# Patient Record
Sex: Female | Born: 1965
Health system: Southern US, Community
[De-identification: ages and names within clinical notes are randomized; demographics above are authoritative.]

## PROBLEM LIST (undated history)

## (undated) DIAGNOSIS — D649 Anemia, unspecified: Secondary | ICD-10-CM

## (undated) HISTORY — PX: BREAST EXCISIONAL BIOPSY: SUR124

## (undated) HISTORY — PX: HEMORRHOID SURGERY: SHX153

## (undated) HISTORY — PX: BREAST BIOPSY: SHX20

## (undated) HISTORY — PX: BREAST LUMPECTOMY: SHX2

---

## 2001-02-20 ENCOUNTER — Ambulatory Visit (HOSPITAL_COMMUNITY): Admission: RE | Admit: 2001-02-20 | Discharge: 2001-02-20 | Payer: Self-pay | Admitting: General Surgery

## 2001-02-20 ENCOUNTER — Encounter: Payer: Self-pay | Admitting: General Surgery

## 2001-05-24 ENCOUNTER — Ambulatory Visit (HOSPITAL_COMMUNITY): Admission: RE | Admit: 2001-05-24 | Discharge: 2001-05-24 | Payer: Self-pay | Admitting: General Surgery

## 2003-04-20 ENCOUNTER — Encounter: Admission: RE | Admit: 2003-04-20 | Discharge: 2003-04-20 | Payer: Self-pay | Admitting: General Surgery

## 2003-07-27 ENCOUNTER — Ambulatory Visit (HOSPITAL_COMMUNITY): Admission: RE | Admit: 2003-07-27 | Discharge: 2003-07-27 | Payer: Self-pay | Admitting: General Surgery

## 2004-03-15 ENCOUNTER — Ambulatory Visit (HOSPITAL_COMMUNITY): Admission: RE | Admit: 2004-03-15 | Discharge: 2004-03-15 | Payer: Self-pay | Admitting: Family Medicine

## 2005-04-04 ENCOUNTER — Ambulatory Visit (HOSPITAL_COMMUNITY): Admission: RE | Admit: 2005-04-04 | Discharge: 2005-04-04 | Payer: Self-pay | Admitting: Family Medicine

## 2005-08-29 ENCOUNTER — Ambulatory Visit (HOSPITAL_COMMUNITY): Admission: RE | Admit: 2005-08-29 | Discharge: 2005-08-29 | Payer: Self-pay | Admitting: General Surgery

## 2006-03-14 ENCOUNTER — Ambulatory Visit: Payer: Self-pay | Admitting: Orthopedic Surgery

## 2006-03-22 ENCOUNTER — Ambulatory Visit (HOSPITAL_COMMUNITY): Admission: RE | Admit: 2006-03-22 | Discharge: 2006-03-22 | Payer: Self-pay | Admitting: Orthopedic Surgery

## 2006-04-02 ENCOUNTER — Ambulatory Visit: Payer: Self-pay | Admitting: Orthopedic Surgery

## 2006-05-14 ENCOUNTER — Ambulatory Visit: Payer: Self-pay | Admitting: Orthopedic Surgery

## 2006-08-16 ENCOUNTER — Ambulatory Visit: Payer: Self-pay | Admitting: Orthopedic Surgery

## 2006-10-01 ENCOUNTER — Encounter: Admission: RE | Admit: 2006-10-01 | Discharge: 2006-10-01 | Payer: Self-pay | Admitting: General Surgery

## 2006-10-04 ENCOUNTER — Encounter: Admission: RE | Admit: 2006-10-04 | Discharge: 2006-10-04 | Payer: Self-pay | Admitting: General Surgery

## 2007-03-20 ENCOUNTER — Encounter: Admission: RE | Admit: 2007-03-20 | Discharge: 2007-03-20 | Payer: Self-pay | Admitting: General Surgery

## 2007-10-22 ENCOUNTER — Encounter: Admission: RE | Admit: 2007-10-22 | Discharge: 2007-10-22 | Payer: Self-pay | Admitting: General Surgery

## 2007-10-28 ENCOUNTER — Encounter: Admission: RE | Admit: 2007-10-28 | Discharge: 2007-10-28 | Payer: Self-pay | Admitting: General Surgery

## 2008-04-30 ENCOUNTER — Encounter: Admission: RE | Admit: 2008-04-30 | Discharge: 2008-04-30 | Payer: Self-pay | Admitting: General Surgery

## 2008-10-22 ENCOUNTER — Encounter: Admission: RE | Admit: 2008-10-22 | Discharge: 2008-10-22 | Payer: Self-pay | Admitting: General Surgery

## 2009-05-31 ENCOUNTER — Ambulatory Visit: Payer: Self-pay | Admitting: Orthopedic Surgery

## 2009-05-31 DIAGNOSIS — M25519 Pain in unspecified shoulder: Secondary | ICD-10-CM | POA: Insufficient documentation

## 2009-10-25 ENCOUNTER — Encounter: Admission: RE | Admit: 2009-10-25 | Discharge: 2009-10-25 | Payer: Self-pay | Admitting: General Surgery

## 2009-12-13 ENCOUNTER — Ambulatory Visit: Payer: Self-pay | Admitting: Orthopedic Surgery

## 2009-12-13 DIAGNOSIS — M5430 Sciatica, unspecified side: Secondary | ICD-10-CM | POA: Insufficient documentation

## 2009-12-13 DIAGNOSIS — M25559 Pain in unspecified hip: Secondary | ICD-10-CM | POA: Insufficient documentation

## 2009-12-13 DIAGNOSIS — M549 Dorsalgia, unspecified: Secondary | ICD-10-CM | POA: Insufficient documentation

## 2009-12-17 ENCOUNTER — Encounter: Payer: Self-pay | Admitting: Orthopedic Surgery

## 2010-01-26 ENCOUNTER — Telehealth: Payer: Self-pay | Admitting: Orthopedic Surgery

## 2010-02-05 ENCOUNTER — Ambulatory Visit (HOSPITAL_COMMUNITY)
Admission: RE | Admit: 2010-02-05 | Discharge: 2010-02-05 | Payer: Self-pay | Source: Home / Self Care | Admitting: Family Medicine

## 2010-03-27 ENCOUNTER — Encounter: Payer: Self-pay | Admitting: General Surgery

## 2010-04-05 NOTE — Progress Notes (Signed)
Summary: call from patient  Phone Note Call from Patient   Caller: Patient Summary of Call: Patient called today about injury to 2nd toe, DOI last Thurs 01/20/10, states happened when working out.  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  Advised that since Dr Romeo Apple will be out till Tues 02/01/10 that she contact PCP or Urgent care or one of the Urgent care orthopedic/sports medicine offices.  States may do that and will call back if needs appt here. Initial call taken by: Cammie Sickle,  January 26, 2010 9:18 AM

## 2010-04-05 NOTE — Progress Notes (Signed)
Summary: Progress note  Progress note   Imported By: Jacklynn Ganong 05/28/2009 09:08:38  _____________________________________________________________________  External Attachment:    Type:   Image     Comment:   External Document

## 2010-04-05 NOTE — Letter (Signed)
Summary: History form  History form   Imported By: Jacklynn Ganong 06/04/2009 08:04:54  _____________________________________________________________________  External Attachment:    Type:   Image     Comment:   External Document

## 2010-04-05 NOTE — Letter (Signed)
Summary: History form  History form   Imported By: Jacklynn Ganong 12/17/2009 07:50:39  _____________________________________________________________________  External Attachment:    Type:   Image     Comment:   External Document

## 2010-04-05 NOTE — Assessment & Plan Note (Signed)
Summary: RT SHOULDER PAIN NEEDS XR/TO PAY $100/BSF   Visit Type:  new problem  CC:  right shoulder pain.  History of Present Illness: 45 year old female has a new problem today she has pain over the RIGHT distal clavicle.  She says she's been lifting some weights workout a personal trainer pain has become very bothersome for her it is moderate to severe it is associated with activity relieved with rest to some degree but not relieved with over-the-counter anti-inflammatories.  It does not radiate below the elbow.  Appears to be over the top of the shoulder.   Xrays today.  Medications: none  Allergies: No Known Drug Allergies  Review of Systems  The review of systems is negative for Constitutional, Cardiovascular, Respiratory, Gastrointestinal, Genitourinary, Neurologic, Musculoskeletal, Endocrine, Psychiatric, Skin, HEENT, Immunology, and Hemoatologic.  Physical Exam  Additional Exam:  RIGHT shoulder examination there is tenderness over the distal clavicle this does not affect her range of motion which is full her shoulder is stable she has excellent muscle strength and motor and motor tone.  She has a positive abduction test negative impingement test skin is normal sensations normal pulses are normal and the RIGHT arm  Vitals are as recorded appearance is normal  She is oriented times person place and time mood is normal.   Impression & Recommendations:  Problem # 1:  SHOULDER PAIN (ICD-719.41) Assessment New  AP lateral RIGHT shoulder x-rays ordered and reviewed  Radiographic images show normal glenohumeral joint normal shoulder Shenton's line normal type II acromion  Impression normal shoulder  This is associated with ostial lysis of the distal clavicle  Recommend no bench pressing or shoulder presses four-month, using Aspercreme 3 times a day, use Norco for pain.  Her updated medication list for this problem includes:    Norco 5-325 Mg Tabs  (Hydrocodone-acetaminophen) .Marland Kitchen... 1 q 6 as needed  Orders: Est. Patient Level III (16109)  Medications Added to Medication List This Visit: 1)  Norco 5-325 Mg Tabs (Hydrocodone-acetaminophen) .Marland Kitchen.. 1 q 6 as needed  Patient Instructions: 1)  No benching No military presses for 1 month  2)  Aspercrem three times a day  3)  norco as needed  4)  f/u as needed  Prescriptions: NORCO 5-325 MG TABS (HYDROCODONE-ACETAMINOPHEN) 1 q 6 as needed  #42 x 2   Entered and Authorized by:   Fuller Canada MD   Signed by:   Fuller Canada MD on 05/31/2009   Method used:   Print then Give to Patient   RxID:   [id number]

## 2010-04-05 NOTE — Progress Notes (Signed)
Summary: Initial evaluation  Initial evaluation   Imported By: Jacklynn Ganong 05/28/2009 09:09:20  _____________________________________________________________________  External Attachment:    Type:   Image     Comment:   External Document

## 2010-04-05 NOTE — Assessment & Plan Note (Signed)
Summary: LEFT HIP PAIN NEDS XR/SELFPAY/BSF   Vital Signs:  Patient profile:   45 year old female Height:      70 inches Weight:      140 pounds Pulse rate:   78 / minute Resp:     16 per minute  Vitals Entered By: Fuller Canada MD (December 13, 2009 3:00 PM)  Visit Type:  new problem Referring Kobe Ofallon:  self Primary Melanni Benway:  Dr. Phillips Odor  CC:  left hip pain.  History of Present Illness: This is a 45 year old female presents with a new problem of LEFT hip pain for 4 months.  She complains of sharp dull moderate pain in her lower back and across her LEFT hip which radiates to her LEFT knee.  She denies any numbness weakness or catching.  Denies locking.  The pain occurs morning and night after exercise and came on suddenly.  She does get some relief from Advil 200 mg as needed and Mobic and Flexeril which she takes twice a month.  Red Flags: Denies  numbness; tingling, bowel /bladder problems, fever night sweats     Meds: Meloxicam, Flexeril.    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  Review of Systems Constitutional:  Denies weight loss, weight gain, fever, chills, and fatigue. Cardiovascular:  Denies chest pain, palpitations, fainting, and murmurs. Respiratory:  Denies short of breath, wheezing, couch, tightness, pain on inspiration, and snoring . Gastrointestinal:  Denies heartburn, nausea, vomiting, diarrhea, constipation, and blood in your stools. Genitourinary:  Denies frequency, urgency, difficulty urinating, painful urination, flank pain, and bleeding in urine. Neurologic:  Denies numbness, tingling, unsteady gait, dizziness, tremors, and seizure. Musculoskeletal:  Denies joint pain, swelling, instability, stiffness, redness, heat, and muscle pain. Endocrine:  Denies excessive thirst, exessive urination, and  heat or cold intolerance. Psychiatric:  Denies nervousness, depression, anxiety, and hallucinations. Skin:  Denies changes in the skin, poor healing, rash, itching, and redness. HEENT:  Denies blurred or double vision, eye pain, redness, and watering. Immunology:  Denies seasonal allergies, sinus problems, and allergic to bee stings. Hemoatologic:  Denies easy bleeding and brusing.  Physical Exam  Additional Exam:  GEN: well developed, well nourished, normal grooming and hygiene, no deformity and normal body habitus.   CDV: pulses are normal, no edema, no erythema. no tenderness  Lymph: normal lymph nodes   Skin: no rashes, skin lesions or open sores   NEURO: normal coordination, reflexes, sensation.   Psyche: awake, alert and oriented. Mood normal   Gait: Normal  Lumbar spine tenderness at L5-S1, LEFT SI joint, RIGHT SI joint, LEFT gluteal soft tissue.  Straight leg raise is negative.  Normal range of motion lumbar spine.  No increased muscle rigidity.  Lower extremities normal range of motion strength stability and alignment and no tenderness over the greater trochanters.      Impression & Recommendations:  Problem # 1:  BACK PAIN (ICD-724.5) Assessment New  Her updated medication list for this problem includes:    Norco 5-325 Mg Tabs (Hydrocodone-acetaminophen) .Marland Kitchen... 1 q 6 as needed  Orders: Est. Patient Level IV (16109) Pelvis x-ray, 1/2 views (72170) Lumbosacral Spine ,2/3 views (72100)  Problem # 2:  SCIATICA (ICD-724.3) Assessment: New  Her updated medication list for this problem includes:    Norco 5-325 Mg Tabs (Hydrocodone-acetaminophen) .Marland Kitchen... 1 q 6 as needed x-rays lumbar spine  and pelvis.  Normal femoral head, normal joint spaces RIGHT and LEFT hip.  Lumbar spine shows mild joint space narrowing at L5-S1 with loss of normal lumbar contour.  No fracture, spondylolisthesis or lysis or tumor.  Impression normal pelvis, mild joint space narrowing  lumbar spine L5-S1  Orders: Est. Patient Level IV (16109) Pelvis x-ray, 1/2 views (60454) Lumbosacral Spine ,2/3 views (09811)  Patient Instructions: 1)  You have a mild case of sciatica  2)  its ok to take the meloxicam and the flexeril on a more consistent basis  3)  Please schedule a follow-up appointment as needed.

## 2010-06-11 ENCOUNTER — Emergency Department (HOSPITAL_COMMUNITY)
Admission: EM | Admit: 2010-06-11 | Discharge: 2010-06-11 | Disposition: A | Discharge status: Home / Self Care | Payer: Self-pay | Attending: Emergency Medicine | Admitting: Emergency Medicine

## 2010-06-11 DIAGNOSIS — H109 Unspecified conjunctivitis: Secondary | ICD-10-CM | POA: Insufficient documentation

## 2010-07-22 NOTE — Op Note (Signed)
[facility]  Patient:    Holly Hodges, Holly Hodges Visit Number: [id number] MRN: [id number]          Service Type: DSU Location: DAY Attending Physician:  Dalia Heading Dictated by:   Franky Macho, M.D. Proc. Date: 05/24/01 Admit Date:  05/24/2001 Discharge Date: 05/24/2001   CC:         Patrica Duel, M.D.   Operative Report  AGE:  45  PREOPERATIVE DIAGNOSIS:  Right breast mass.  POSTOPERATIVE DIAGNOSIS:  Right breast mass.  PROCEDURE:  Right partial mastectomy.  SURGEON:  Franky Macho, M.D.  ANESTHESIA:  MAC.  INDICATIONS:  The patient is a 45 year old black female who presents with a dominant mass at the 9 oclock position in the right breast.  The risks and benefits of the procedure, including bleeding, infection, and recurrence of the mass, were fully explained to the patient, who gave an informed consent.  DESCRIPTION OF PROCEDURE:  The patient was placed in the supine position.  The right breast was prepped and draped using the usual sterile technique with Betadine.  Xylocaine 1% was used for local anesthesia.  A curvilinear incision was made at the 9 oclock position in the right breast.  This was taken down to the mass and the mass was fully excised without difficulty.  It appeared to be sclerosing adenosis.  This was sent to pathology for further examination. Any bleeding was controlled using Bovie electrocautery.  The skin was reapproximated using a #4-0 Vicryl subcuticular suture.  Steri-Strips and a dry sterile dressing were applied.  All tape and needle counts were correct at the end of the procedure.  The patient was transferred to Day Surgery in stable condition.  COMPLICATIONS:  None.  SPECIMEN:  Right breast mass.  ESTIMATED BLOOD LOSS:  Minimal.Dictated by:   Franky Macho, M.D.  Attending Physician:  Dalia Heading DD:  05/24/01 TD:  05/27/01 Job: [id number]

## 2010-07-22 NOTE — Procedures (Signed)
NAME:  Holly Hodges, Holly Hodges NO.:  [id number]   MEDICAL RECORD NO.:  [id number]          PATIENT TYPE:  OUT   LOCATION:  RAD                           FACILITY:  [facility]   PHYSICIAN:  Dani Gobble, MD       DATE OF BIRTH:  02-16-1966   DATE OF PROCEDURE:  03/15/2004  DATE OF DISCHARGE:                                  ECHOCARDIOGRAM   INDICATIONS:  Holly Hodges is a 45 year old female with a history of chest  pain and a systolic murmur referred for an echocardiogram.   DESCRIPTION OF PROCEDURE:  The technical quality of the study is adequate.   The aorta is within normal limits at 2.2 cm.   The left atrium is within normal limits at 3.4 cm.  No obvious clots or  masses were appreciated and the patient appeared to be in sinus rhythm  during this procedure.   The interventricular septum was mildly thickened at 1.2 cm while the  posterior wall was within normal limits at 0.8 cm.   The aortic valve was thin, trileaflet, and pliable with normal leaflet  excursion.  No aortic insufficiency was noted.  Doppler interrogation of the  aortic valve is within normal limits.   The mitral valve appears structurally normal.  No mitral valve prolapse is  noted.  No significant mitral regurgitation is noted.  Doppler interrogation  of the mitral valve is within normal limits.   The pulmonic valve is incompletely visualized but appeared grossly  structurally normal.   The tricuspid valve also appears grossly structurally normal with trivial  tricuspid regurgitation noted.   The left ventricle is normal in size with the LVIDD measured at 4.1 cm and  the LVIC measured at 2.6 cm.  Overall left ventricular systolic function is  normal and no regional wall motion abnormalities are noted.  There is no  evidence of diastolic dysfunction on this study.   The right atrium and right ventricle are normal in size and the right  ventricular systolic function is normal.   IMPRESSION:  1.   Mild asymmetrical septal hypertrophy.  2.  Trivial tricuspid regurgitation.  3.  Normal left ventricular size and systolic function without regional wall      motion abnormality noted.      AB/MEDQ  D:  03/15/2004  T:  03/15/2004  Job:  [id number]

## 2010-07-22 NOTE — H&P (Signed)
NAME:  Holly Hodges, Holly Hodges NO.:  [id number]   MEDICAL RECORD NO.:  [id number]                  PATIENT TYPE:   LOCATION:                                       FACILITY:   PHYSICIAN:  Dalia Heading, M.D.               DATE OF BIRTH:   DATE OF ADMISSION:  DATE OF DISCHARGE:                                HISTORY & PHYSICAL   CHIEF COMPLAINT:  Left-breast mass.   HISTORY OF PRESENT ILLNESS:  The patient is a 45 year old black female who  presents with a dominant mass at the 2 o'clock position on the left breast.  Ultrasound and mammogram has been negative for malignancy, but the mass is  dominant in nature, and the patient would like it removed.   PAST MEDICAL HISTORY:  Includes hemorrhoidal disease.   PAST SURGICAL HISTORY:  1. Right breast biopsy x2 in the past.  2. Hemorrhoidectomy in 1998.   CURRENT MEDICATIONS:  None.   ALLERGIES:  No known drug allergies.   REVIEW OF SYSTEMS:  Noncontributory.   PHYSICAL EXAMINATION:  GENERAL:  The patient is a well-developed, well-  nourished black female in no acute distress.  VITAL SIGNS:  She is afebrile, and vital signs are stable.  NECK:  Supple without lymphadenopathy.  LUNGS:  Clear to auscultation with good breath sounds bilaterally.  HEART:  Examination reveals a regular rate and rhythm without S3, S4, or  murmurs.  BREASTS:  The left breast examination reveals a 1.5 cm, ovoid, mobile, well-  circumscribed mass at the 2 o'clock position.  No nipple discharge or  dimpling is noted.  The axilla is negative for palpable nodes.  Right breast  examination reveals no dominant mass, nipple discharge, or dimpling.  The  axilla is negative for palpable nodes.   IMPRESSION:  Left breast mass.   PLAN:  The patient is scheduled for left breast biopsy on Jul 27, 2003.  The  risks and benefits of the procedure, including bleeding and infection, were  fully explained to the patient, who gave informed  consent.     ___________________________________________                                         Dalia Heading, M.D.   MAJ/MEDQ  D:  07/23/2003  T:  07/23/2003  Job:  [id number]   cc:   Patrica Duel, M.D.  [address], [address]  [place]  [place] [address]  Fax: [phone]   Tilda Burrow, M.D.  [address] [place]  [place] [address]  Fax: [phone]

## 2010-07-22 NOTE — H&P (Signed)
[facility]  Patient:    Holly Hodges, Holly Hodges Visit Number: [id number] MRN: [id number]          Service Type: DSU Location: DAY Attending Physician:  Dalia Heading Dictated by:   Franky Macho, M.D. Admit Date:  05/24/2001 Discharge Date: 05/24/2001   CC:         Patrica Duel, M.D.   History and Physical  DATE OF BIRTH:  [birth year]  CHIEF COMPLAINT:  Right breast mass.  HISTORY OF PRESENT ILLNESS:  The patient is a 45 year old black female with presents with a right breast nodule.  It has been present for several months now and is tender to touch.  No nipple discharge has been noted.  There is no change in size noted.  She had a mammogram and ultrasound done of the right breast in December 2002, which was negative.  She has had a previous right breast biopsy in the nipple area in 1999, which was negative for malignancy. There is no family history of breast carcinoma.  PAST MEDICAL HISTORY:  Hemorrhoidal disease.  PAST SURGICAL HISTORY:  Right breast biopsy in 1999, hemorrhoidectomy in 1998.  CURRENT MEDICATIONS:  None.  ALLERGIES:  No known drug allergies.  REVIEW OF SYSTEMS:  Unremarkable.  PHYSICAL EXAMINATION:  GENERAL:  The patient is a well-developed and well-nourished black female in no acute distress.  VITAL SIGNS:  She is afebrile and vital signs are stable.  NECK:  Supple without lymphadenopathy.  LUNGS:  Clear to auscultation with equal breath sounds bilaterally.  HEART:  Examination reveals a regular rate and rhythm without S3, S4, or murmurs.  BREASTS:  Right breast examination reveals a small, pea-sized, mobile, less than 1 cm nodule noted at the 9 oclock position.  It is mobile.  Needle aspiration was negative for a cyst.  No nipple discharge or dimpling is noted. The axilla was negative for palpable nodes.  The left breast examination was unremarkable.  IMPRESSION:  Right breast mass.  PLAN:  The patient was  scheduled for right breast biopsy on May 24, 2001. The risks and benefits of the procedure including bleeding and infection were fully explained to the patient.  Gave informed consent. Dictated by:   Franky Macho, M.D. Attending Physician:  Dalia Heading DD:  05/21/01 TD:  05/22/01 Job: [id number]

## 2010-07-22 NOTE — H&P (Signed)
NAME:  Holly Hodges, Holly Hodges NO.:  [id number]   MEDICAL RECORD NO.:  [id number]                  PATIENT TYPE:   LOCATION:                                       FACILITY:   PHYSICIAN:  Dalia Heading, M.D.               DATE OF BIRTH:   DATE OF ADMISSION:  DATE OF DISCHARGE:                                HISTORY & PHYSICAL   AGE:  45 years old.   CHIEF COMPLAINT:  Left breast mass.   HISTORY OF PRESENT ILLNESS:  The patient is a 45 year old black female who  presents with a dominant mass at the 2 o'clock position in the left breast.  Ultrasound and mammography have been negative for malignancy, but the mass  is dominant in nature and the patient would like it removed.   PAST MEDICAL HISTORY:  Hemorrhoidal disease.   PAST SURGICAL HISTORY:  Right breast biopsy x2 in the past, hemorrhoidectomy  in 1998.   CURRENT MEDICATIONS:  None.   ALLERGIES:  No known drug allergies.   REVIEW OF SYSTEMS:  Noncontributory.   PHYSICAL EXAMINATION:  GENERAL:  The patient is a well-developed, well-  nourished black female in no acute distress.  VITAL SIGNS:  She is afebrile and vital signs are stable.  NECK:  Supple without lymphadenopathy.  LUNGS:  Clear to auscultation with equal breath sounds bilaterally.  HEART:  Reveals a regular rate and rhythm without S3, S4, or murmurs.  BREASTS:  Right breast examination reveals no dominant mass, nipple  discharge, or dimpling.  The axilla is negative for palpable nodes.  Left  breast examination reveals a 1.5 cm ovoid, mobile, well-circumscribed mass  at the 2 o'clock position.  No nipple discharge or dimpling is noted.  The  axilla is negative for palpable nodes.   IMPRESSION:  Left breast mass.   PLAN:  The patient is scheduled for left breast biopsy on May 27, 2003.  The risks and benefits of the procedure including bleeding and infection  were fully explained to the patient, who gave informed consent.     ___________________________________________                                         Dalia Heading, M.D.   MAJ/MEDQ  D:  05/21/2003  T:  05/21/2003  Job:  [id number]   cc:   Patrica Duel, M.D.  [address]  [place]  [place] [address]  Fax: [phone]

## 2010-07-22 NOTE — Op Note (Signed)
NAME:  Holly Hodges, Holly Hodges                         ACCOUNT NO.:  [id number]   MEDICAL RECORD NO.:  [id number]                   PATIENT TYPE:  AMB   LOCATION:  DAY                                  FACILITY:  [facility]   PHYSICIAN:  Dalia Heading, M.D.               DATE OF BIRTH:  Jun 16, 1965   DATE OF PROCEDURE:  07/27/2003  DATE OF DISCHARGE:                                 OPERATIVE REPORT   PREOPERATIVE DIAGNOSIS:  Left breast mass.   POSTOPERATIVE DIAGNOSIS:  Left breast mass.   PROCEDURE:  Left partial mastectomy.   SURGEON:  Dalia Heading, M.D.   ANESTHESIA:  MAC.   INDICATIONS FOR PROCEDURE:  The patient is a 45 year old black female who  presents with a dominant mass at the 2 o'clock position of the left breast.  The risks and benefits of the procedure, including bleeding, infection, and  the possibility of malignancy were fully explained to the patient who gave  informed consent.   DESCRIPTION OF PROCEDURE:  The patient was placed in the supine position.  The left breast was prepped and draped using the usual sterile technique  with Betadine.  Surgical site confirmation was performed.   Xylocaine 1% was used for local anesthesia.  A curvilinear incision was made  over the mass at the 2 o'clock position.  This was taken down to the mass.  The mass appeared to be enveloped with sclerosis.  The mass was excised  without difficulty.  The mass was sent to pathology for further examination.  Any bleeding was controlled using Bovie electrocautery.  The skin was  reapproximated using a 4-0 Vicryl subcuticular suture.  Steri-Strips and a  dry sterile dressing were applied.   All tape and needle counts were correct at the end of the procedure.  The  patient was awakened and transferred to day surgery in stable condition.   COMPLICATIONS:  None.   SPECIMENS:  Left breast mass.   ESTIMATED BLOOD LOSS:  Minimal.      ___________________________________________                              Dalia Heading, M.D.   MAJ/MEDQ  D:  07/27/2003  T:  07/27/2003  Job:  [id number]   cc:   Patrica Duel, M.D.  [address]  [place]  [place] [address]  Fax: [phone]   Tilda Burrow, M.D.  [address] [place]  [place] [address]  Fax: [phone]

## 2010-08-18 ENCOUNTER — Emergency Department (HOSPITAL_COMMUNITY): Payer: Self-pay

## 2010-08-18 ENCOUNTER — Emergency Department (HOSPITAL_COMMUNITY)
Admission: EM | Admit: 2010-08-18 | Discharge: 2010-08-18 | Disposition: A | Discharge status: Home / Self Care | Payer: Self-pay | Attending: Emergency Medicine | Admitting: Emergency Medicine

## 2010-08-18 DIAGNOSIS — R079 Chest pain, unspecified: Secondary | ICD-10-CM | POA: Insufficient documentation

## 2010-08-18 DIAGNOSIS — Z7982 Long term (current) use of aspirin: Secondary | ICD-10-CM | POA: Insufficient documentation

## 2010-10-19 ENCOUNTER — Other Ambulatory Visit: Payer: Self-pay | Admitting: General Surgery

## 2010-10-19 DIAGNOSIS — Z1231 Encounter for screening mammogram for malignant neoplasm of breast: Secondary | ICD-10-CM

## 2010-10-28 ENCOUNTER — Ambulatory Visit: Payer: Self-pay

## 2010-11-24 ENCOUNTER — Ambulatory Visit: Payer: Self-pay

## 2010-11-28 ENCOUNTER — Ambulatory Visit
Admission: RE | Admit: 2010-11-28 | Discharge: 2010-11-28 | Disposition: A | Discharge status: Home / Self Care | Payer: Self-pay | Source: Ambulatory Visit | Attending: General Surgery | Admitting: General Surgery

## 2010-11-28 DIAGNOSIS — Z1231 Encounter for screening mammogram for malignant neoplasm of breast: Secondary | ICD-10-CM

## 2011-12-05 ENCOUNTER — Other Ambulatory Visit: Payer: Self-pay | Admitting: General Surgery

## 2011-12-05 DIAGNOSIS — Z1231 Encounter for screening mammogram for malignant neoplasm of breast: Secondary | ICD-10-CM

## 2011-12-18 ENCOUNTER — Ambulatory Visit
Admission: RE | Admit: 2011-12-18 | Discharge: 2011-12-18 | Disposition: A | Discharge status: Home / Self Care | Payer: BC Managed Care – PPO | Source: Ambulatory Visit | Attending: General Surgery | Admitting: General Surgery

## 2011-12-18 DIAGNOSIS — Z1231 Encounter for screening mammogram for malignant neoplasm of breast: Secondary | ICD-10-CM

## 2012-11-26 ENCOUNTER — Other Ambulatory Visit: Payer: Self-pay

## 2012-11-26 DIAGNOSIS — Z1231 Encounter for screening mammogram for malignant neoplasm of breast: Secondary | ICD-10-CM

## 2012-12-19 ENCOUNTER — Ambulatory Visit
Admission: RE | Admit: 2012-12-19 | Discharge: 2012-12-19 | Disposition: A | Discharge status: Home / Self Care | Payer: BC Managed Care – PPO | Source: Ambulatory Visit

## 2012-12-19 DIAGNOSIS — Z1231 Encounter for screening mammogram for malignant neoplasm of breast: Secondary | ICD-10-CM

## 2013-03-04 ENCOUNTER — Ambulatory Visit (INDEPENDENT_AMBULATORY_CARE_PROVIDER_SITE_OTHER): Payer: BC Managed Care – PPO | Admitting: Orthopedic Surgery

## 2013-03-04 ENCOUNTER — Ambulatory Visit (INDEPENDENT_AMBULATORY_CARE_PROVIDER_SITE_OTHER): Payer: BC Managed Care – PPO

## 2013-03-04 VITALS — BP 117/71 | Ht 68.0 in | Wt 151.0 lb

## 2013-03-04 DIAGNOSIS — M25569 Pain in unspecified knee: Secondary | ICD-10-CM

## 2013-03-04 DIAGNOSIS — M7650 Patellar tendinitis, unspecified knee: Secondary | ICD-10-CM

## 2013-03-04 DIAGNOSIS — M25562 Pain in left knee: Secondary | ICD-10-CM

## 2013-03-04 DIAGNOSIS — M7652 Patellar tendinitis, left knee: Secondary | ICD-10-CM

## 2013-03-04 MED ORDER — PREDNISONE (PAK) 5 MG PO TABS: ORAL_TABLET | ORAL | Status: DC

## 2013-03-04 NOTE — Patient Instructions (Signed)
Take prednisone dose pack x 12 days   No running or jumping No squatting until pain free

## 2013-03-05 DIAGNOSIS — M7650 Patellar tendinitis, unspecified knee: Secondary | ICD-10-CM | POA: Insufficient documentation

## 2013-03-05 NOTE — Progress Notes (Signed)
Patient ID: Holly Hodges, female   DOB: [birth year], 47 y.o.   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 She ambulates normally with a slight limp favoring the left lower extremity. She is tenderness in the patella the joint lines are nontender she has painful range of motion with flexion and extension her knee is stable strength is normal skin is intact shows good pulse no  abnormal lymph nodes. Sensation is normal  X-rays are negative  Patellar tendinitis  Recommend dose pack repeat if not better after 2 weeks  Follow up in 4 weeks

## 2013-03-18 ENCOUNTER — Telehealth: Payer: Self-pay | Admitting: *Deleted

## 2013-03-18 NOTE — Telephone Encounter (Signed)
Patient called c/o left knee swelling and hurting again. She ask what she could do for it? I advised per DR, Harrison's last office note, that she could repeat prednisone dose pack. She stated she really hated to take all those pills. She ask about icing. I advised her she could try icing and staying off it as much as possible, but if that did not work repeat dose pack.

## 2013-04-01 ENCOUNTER — Ambulatory Visit (INDEPENDENT_AMBULATORY_CARE_PROVIDER_SITE_OTHER): Payer: BC Managed Care – PPO | Admitting: Orthopedic Surgery

## 2013-04-01 VITALS — BP 137/84 | Ht 68.0 in | Wt 151.0 lb

## 2013-04-01 DIAGNOSIS — M7650 Patellar tendinitis, unspecified knee: Secondary | ICD-10-CM | POA: Insufficient documentation

## 2013-04-01 NOTE — Progress Notes (Signed)
Patient ID: Holly Hodges, female   DOB: Jul 06, 1965, 48 y.o.   MRN: [id number] Chief Complaint  Patient presents with  . Follow-up    1 month recheck on left knee tendonitis.    BP 137/84  Ht 5\' 8"  (1.727 m)  Wt 151 lb (68.493 kg)  BMI 22.96 kg/m2  Encounter Diagnosis  Name Primary?  . Patellar tendinitis Yes   Recheck after steroid Dosepak x1 patient was concerned that there was too much medication so she didn't take the Dosepak x2  Reports improvement in her knee pain but notices that she can wear heels but not flats  No catching locking giving way  Vitals as recorded stable appearance normal she is oriented x3 mood is normal when she stands flat-footed she does go and hyperextension she has minimal tenderness over the knee pain with resisted extension but intact straight leg raise without lag knee remains stable joint lines remain nontender  Encounter Diagnosis  Name Primary?  . Patellar tendinitis Yes    Continue ibuprofen now 800 twice a day for 3 weeks come back in a month if no improvement inject

## 2013-04-01 NOTE — Patient Instructions (Addendum)
Continue Ibuprofen 800 mg twice a day for 3 weeks

## 2013-05-01 ENCOUNTER — Ambulatory Visit: Payer: BC Managed Care – PPO | Admitting: Orthopedic Surgery

## 2013-05-21 ENCOUNTER — Encounter: Payer: Self-pay | Admitting: Orthopedic Surgery

## 2013-09-02 ENCOUNTER — Other Ambulatory Visit (HOSPITAL_COMMUNITY): Payer: Self-pay | Admitting: Family Medicine

## 2013-09-02 ENCOUNTER — Ambulatory Visit (HOSPITAL_COMMUNITY)
Admission: RE | Admit: 2013-09-02 | Discharge: 2013-09-02 | Disposition: A | Discharge status: Home / Self Care | Payer: BC Managed Care – PPO | Source: Ambulatory Visit | Attending: Family Medicine | Admitting: Family Medicine

## 2013-09-02 DIAGNOSIS — J301 Allergic rhinitis due to pollen: Secondary | ICD-10-CM

## 2013-09-02 DIAGNOSIS — R05 Cough: Secondary | ICD-10-CM

## 2013-09-02 DIAGNOSIS — R059 Cough, unspecified: Secondary | ICD-10-CM

## 2013-09-25 ENCOUNTER — Encounter (HOSPITAL_COMMUNITY): Payer: Self-pay | Admitting: Pharmacy Technician

## 2013-09-26 NOTE — H&P (Signed)
  NTS SOAP Note  Vital Signs:  Vitals as of: 09/25/2013: Systolic 117: Diastolic 75: Heart Rate 75: Temp 49F: Height 455ft 7in: Weight 155Lbs 0 Ounces: BMI 24.28  BMI : 24.28 kg/m2  Subjective: This 48 Years 2 Months old Female presents for screening TCS.  Denies any gi complaints.  No family h/o colon carcinoma.  Review of Symptoms:  Constitutional:unremarkable   Head:unremarkable    Eyes:unremarkable   Nose/Mouth/Throat:unremarkable Cardiovascular:  unremarkable   Respiratory:unremarkable   Gastrointestinal:  unremarkable   Genitourinary:unremarkable     Musculoskeletal:unremarkable   Skin:unremarkable Hematolgic/Lymphatic:unremarkable     Allergic/Immunologic:unremarkable     Past Medical History:    Reviewed  Past Medical History  Surgical History: hemorrhoidectomy Medical Problems: allgeries extrinsic Allergies: nkda Medications: flonase   Social History:Reviewed  Social History  Preferred Language: English Race:  Black or African American Ethnicity: Not Hispanic / Latino Age: 48 Years 2 Months Marital Status:  S Alcohol: no   Smoking Status: Never smoker reviewed on 09/25/2013 Functional Status reviewed on 09/25/2013 ------------------------------------------------ Bathing: Normal Cooking: Normal Dressing: Normal Driving: Normal Eating: Normal Managing Meds: Normal Oral Care: Normal Shopping: Normal Toileting: Normal Transferring: Normal Walking: Normal Cognitive Status reviewed on 09/25/2013 ------------------------------------------------ Attention: Normal Decision Making: Normal Language: Normal Memory: Normal Motor: Normal Perception: Normal Problem Solving: Normal Visual and Spatial: Normal   Family History:  Reviewed  Family Health History Mother, Living; Dementia;  Father     Objective Information: General:  Well appearing, well nourished in no distress. Heart:  RRR, no  murmur Lungs:    CTA bilaterally, no wheezes, rhonchi, rales.  Breathing unlabored. Abdomen:Soft, NT/ND, no HSM, no masses.   deferred to procedure  Assessment:Need for screening TCS  Diagnoses: V76.51 Screening for malignant neoplasm of colon (Encounter for screening for malignant neoplasm of colon)  Procedures: [id number] - OFFICE OUTPATIENT NEW 20 MINUTES    Plan:  Scheduled for TCS on 10/14/13.   Patient Education:Alternative treatments to surgery were discussed with patient (and family).  Risks and benefits  of procedure including bleeding and perforation were fully explained to the patient (and family) who gave informed consent. Patient/family questions were addressed.  Follow-up:Pending Surgery

## 2013-10-14 ENCOUNTER — Ambulatory Visit (HOSPITAL_COMMUNITY): Admission: RE | Admit: 2013-10-14 | Payer: BC Managed Care – PPO | Source: Ambulatory Visit | Admitting: General Surgery

## 2013-10-14 ENCOUNTER — Encounter (HOSPITAL_COMMUNITY): Admission: RE | Payer: Self-pay | Source: Ambulatory Visit

## 2013-10-14 SURGERY — COLONOSCOPY
Anesthesia: Moderate Sedation

## 2013-12-03 ENCOUNTER — Ambulatory Visit: Payer: BC Managed Care – PPO | Admitting: Orthopedic Surgery

## 2013-12-04 ENCOUNTER — Ambulatory Visit: Payer: BC Managed Care – PPO | Admitting: Orthopedic Surgery

## 2013-12-17 ENCOUNTER — Other Ambulatory Visit: Payer: Self-pay

## 2013-12-17 DIAGNOSIS — Z1231 Encounter for screening mammogram for malignant neoplasm of breast: Secondary | ICD-10-CM

## 2013-12-25 ENCOUNTER — Ambulatory Visit (INDEPENDENT_AMBULATORY_CARE_PROVIDER_SITE_OTHER): Payer: BC Managed Care – PPO | Admitting: Orthopedic Surgery

## 2013-12-25 VITALS — BP 130/76 | Ht 68.0 in | Wt 151.0 lb

## 2013-12-25 DIAGNOSIS — M5432 Sciatica, left side: Secondary | ICD-10-CM

## 2013-12-25 MED ORDER — GABAPENTIN 100 MG PO CAPS: ORAL_CAPSULE | ORAL | Status: DC

## 2013-12-25 NOTE — Progress Notes (Signed)
Patient ID: Holly Hodges, female   DOB: [birth year], 48 y.o.   MRN: [id number] Patient ID: Holly Hodges, female   DOB: [birth year], 48 y.o.   MRN: [id number]  Chief Complaint  Patient presents with  . Follow-up    Left knee pain    HPI Holly Hodges is a 48 y.o. female.  HPI The patient presents for reevaluation of her left knee previously treated for patellar tendinitis but she is actually having lateral leg pain radiating from her knee down into her foot and it is preventing her from running. Her pain is mild to moderate improvements her from running or jogging she was concerned because of the radicular nature of her symptoms. She's had no improvement with rest. She's not taking any medication. Symptoms present for last 2 months paragraph review of systems GI function and urinary function normal paragraph she's healthy with no major medical problems No past medical history on file.  No past surgical history on file.  No family history on file.  Social History History  Substance Use Topics  . Smoking status: Not on file  . Smokeless tobacco: Not on file  . Alcohol Use: Not on file    No Known Allergies  Current Outpatient Prescriptions  Medication Sig Dispense Refill  . fluticasone (FLONASE) 50 MCG/ACT nasal spray Place 2 sprays into both nostrils daily.      Marland Kitchen. loratadine (CLARITIN) 10 MG tablet Take 10 mg by mouth daily.      Marland Kitchen. gabapentin (NEURONTIN) 100 MG capsule Two caps by mouth at bedtime  60 capsule  2   No current facility-administered medications for this visit.    Review of Systems Review of Systems Negative except for musculoskeletal symptoms Blood pressure 130/76, height 5\' 8"  (1.727 m), weight 151 lb (68.493 kg).  Physical Exam Physical Exam Normal Gen. appearance, oriented x3 normal. Mood and affect normal. Ambulation normal. Patella tendon nontender full range of motion of the knee knee is stable strength is normal she has tenderness in the anterior  compartment of the left leg and around the fibula and then she is tender in her lower back central midline than medial and lateral and left buttock reflexes are intact pulses are normal skin is normal sensation is normal Data Reviewed No imaging was done today  Assessment    Sciatica    Plan    She did not like the prednisone she took for her knee although it helped Meds ordered this encounter  Medications  . gabapentin (NEURONTIN) 100 MG capsule    Sig: Two caps by mouth at bedtime    Dispense:  60 capsule    Refill:  2   Return 4 weeks recheck       Fuller CanadaStanley Harrison 12/25/2013, 3:32 PM

## 2013-12-25 NOTE — Patient Instructions (Signed)
Sciatica °Sciatica is pain, weakness, numbness, or tingling along your sciatic nerve. The nerve starts in the lower back and runs down the back of each leg. Nerve damage or certain conditions pinch or put pressure on the sciatic nerve. This causes the pain, weakness, and other discomforts of sciatica. °HOME CARE  °· Only take medicine as told by your doctor. °· Apply ice to the affected area for 20 minutes. Do this 3-4 times a day for the first 48-72 hours. Then try heat in the same way. °· Exercise, stretch, or do your usual activities if these do not make your pain worse. °· Go to physical therapy as told by your doctor. °· Keep all doctor visits as told. °· Do not wear high heels or shoes that are not supportive. °· Get a firm mattress if your mattress is too soft to lessen pain and discomfort. °GET HELP RIGHT AWAY IF:  °· You cannot control when you poop (bowel movement) or pee (urinate). °· You have more weakness in your lower back, lower belly (pelvis), butt (buttocks), or legs. °· You have redness or puffiness (swelling) of your back. °· You have a burning feeling when you pee. °· You have pain that gets worse when you lie down. °· You have pain that wakes you from your sleep. °· Your pain is worse than past pain. °· Your pain lasts longer than 4 weeks. °· You are suddenly losing weight without reason. °MAKE SURE YOU:  °· Understand these instructions. °· Will watch this condition. °· Will get help right away if you are not doing well or get worse. °Document Released: 11/30/2007 Document Revised: 08/22/2011 Document Reviewed: 07/02/2011 °ExitCare® Patient Information ©2015 [employer]. This information is not intended to replace advice given to you by your health care provider. Make sure you discuss any questions you have with your health care provider. ° °

## 2014-01-01 ENCOUNTER — Ambulatory Visit
Admission: RE | Admit: 2014-01-01 | Discharge: 2014-01-01 | Disposition: A | Discharge status: Home / Self Care | Payer: BC Managed Care – PPO | Source: Ambulatory Visit

## 2014-01-01 DIAGNOSIS — Z1231 Encounter for screening mammogram for malignant neoplasm of breast: Secondary | ICD-10-CM

## 2014-01-22 ENCOUNTER — Ambulatory Visit: Payer: BC Managed Care – PPO | Admitting: Orthopedic Surgery

## 2014-10-14 NOTE — H&P (Signed)
  NTS SOAP Note  Vital Signs:  Vitals as of: 10/13/2014: Systolic 132: Diastolic 80: Heart Rate 82: Temp 64F: Height 73ft 7in: Weight 156Lbs 0 Ounces: BMI 24.43  BMI : 24.43 kg/m2  Subjective: This 49 year old female presents for of need for screening TCS.  No gi complaints noted.  No family h/o colon carcinoma.  Review of Symptoms:  hot Head:unremarkable Eyes:unremarkable   Nose/Mouth/Throat:unremarkable Cardiovascular:  unremarkable Respiratory:unremarkable Gastrointestinal:  unremarkable   Genitourinary:unremarkable   Musculoskeletal:unremarkable Skin:unremarkable Hematolgic/Lymphatic:unremarkable   Allergic/Immunologic:unremarkable   Past Medical History:  Reviewed  Past Medical History  Surgical History: hemorrhoidectomy Medical Problems: allgeries extrinsic Allergies: nkda Medications: flonase   Social History:Reviewed  Social History  Preferred Language: English Race:  Black or African American Ethnicity: Not Hispanic / Latino Age: 24 Years 2 Months Marital Status:  S Alcohol: no   Smoking Status: Never smoker reviewed on 10/13/2014 Functional Status reviewed on 10/13/2014 ------------------------------------------------ Bathing: Normal Cooking: Normal Dressing: Normal Driving: Normal Eating: Normal Managing Meds: Normal Oral Care: Normal Shopping: Normal Toileting: Normal Transferring: Normal Walking: Normal Cognitive Status reviewed on 10/13/2014 ------------------------------------------------ Attention: Normal Decision Making: Normal Language: Normal Memory: Normal Motor: Normal Perception: Normal Problem Solving: Normal Visual and Spatial: Normal   Family History:Reviewed  Family Health History Mother, Living; Dementia;  Father, Healthy;     Objective Information: General:Well appearing, well nourished in no distress. Heart:RRR, no murmur Lungs:  CTA bilaterally, no wheezes, rhonchi, rales.   Breathing unlabored. Abdomen:Soft, NT/ND, no HSM, no masses. deferred to procedure  Assessment:Need for screening TCS  Diagnoses: V76.51  Z12.11 Screening for malignant neoplasm of colon (Encounter for screening for malignant neoplasm of colon)  Procedures: [address] - OFFICE OUTPATIENT VISIT 10 MINUTES    Plan:  Scheduled for screening TCS on 10/20/14.   Patient Education:Alternative treatments to surgery were discussed with patient (and family).  Risks and benefits  of procedure were fully explained to the patient (and family) who gave informed consent. Patient/family questions were addressed.  Follow-up:Pending Surgery

## 2014-10-20 ENCOUNTER — Encounter (HOSPITAL_COMMUNITY): Payer: Self-pay | Admitting: *Deleted

## 2014-10-20 ENCOUNTER — Encounter (HOSPITAL_COMMUNITY)
Admission: RE | Disposition: A | Discharge status: Home / Self Care | Payer: Self-pay | Source: Ambulatory Visit | Attending: General Surgery

## 2014-10-20 ENCOUNTER — Ambulatory Visit (HOSPITAL_COMMUNITY)
Admission: RE | Admit: 2014-10-20 | Discharge: 2014-10-20 | Disposition: A | Discharge status: Home / Self Care | Payer: BC Managed Care – PPO | Source: Ambulatory Visit | Attending: General Surgery | Admitting: General Surgery

## 2014-10-20 DIAGNOSIS — Z1211 Encounter for screening for malignant neoplasm of colon: Secondary | ICD-10-CM | POA: Insufficient documentation

## 2014-10-20 HISTORY — DX: Anemia, unspecified: D64.9

## 2014-10-20 HISTORY — PX: COLONOSCOPY: SHX5424

## 2014-10-20 SURGERY — COLONOSCOPY
Anesthesia: Moderate Sedation

## 2014-10-20 MED ORDER — MEPERIDINE HCL 100 MG/ML IJ SOLN
INTRAMUSCULAR | Status: AC
  Filled 2014-10-20: qty 1

## 2014-10-20 MED ORDER — MIDAZOLAM HCL 5 MG/5ML IJ SOLN
INTRAMUSCULAR | Status: DC | PRN
  Administered 2014-10-20: 3 mg via INTRAVENOUS
  Administered 2014-10-20 (×3): 1 mg via INTRAVENOUS

## 2014-10-20 MED ORDER — MIDAZOLAM HCL 5 MG/5ML IJ SOLN
INTRAMUSCULAR | Status: AC
  Filled 2014-10-20: qty 5

## 2014-10-20 MED ORDER — SODIUM CHLORIDE 0.9 % IV SOLN
INTRAVENOUS | Status: DC
  Administered 2014-10-20: 12:00:00 via INTRAVENOUS

## 2014-10-20 MED ORDER — MEPERIDINE HCL 50 MG/ML IJ SOLN
INTRAMUSCULAR | Status: DC | PRN
  Administered 2014-10-20: 50 mg via INTRAVENOUS
  Administered 2014-10-20: 25 mg via INTRAVENOUS

## 2014-10-20 NOTE — Discharge Instructions (Signed)

## 2014-10-20 NOTE — Op Note (Signed)
[facility] [address] [place], [address]   COLONOSCOPY PROCEDURE REPORT     EXAM DATE: 11/14/14  PATIENT NAME:      Holly, Hodges           MR #:      [id number]  BIRTHDATE:       Dec 10, 1965      VISIT #:     [phone]  ATTENDING:     Franky Macho, MD     STATUS:     outpatient ASSISTANT:  INDICATIONS:  The patient is a 49 yr old female here for a colonoscopy due to average risk patient for colon cancer. PROCEDURE PERFORMED:     Colonoscopy, screening MEDICATIONS:     Demerol 75 mg IV and Versed 6 mg IV ESTIMATED BLOOD LOSS:     None  CONSENT: The patient understands the risks and benefits of the procedure and understands that these risks include, but are not limited to: sedation, allergic reaction, infection, perforation and/or bleeding. Alternative means of evaluation and treatment include, among others: physical exam, x-rays, and/or surgical intervention. The patient elects to proceed with this endoscopic procedure.  DESCRIPTION OF PROCEDURE: During intra-op preparation period all mechanical & medical equipment was checked for proper function. Hand hygiene and appropriate measures for infection prevention was taken. After the risks, benefits and alternatives of the procedure were thoroughly explained, Informed consent was verified, confirmed and timeout was successfully executed by the treatment team. A digital exam revealed no abnormalities of the rectum. The EC-[id number] ([id number]) endoscope was introduced through the anus and advanced to the cecum, which was identified by both the appendix and ileocecal valve. adequate (Suprep was used) The instrument was then slowly withdrawn as the colon was fully examined.Estimated blood loss is zero unless otherwise noted in this procedure report.   COLON FINDINGS: A normal appearing cecum, ileocecal valve, and appendiceal orifice were identified.  The ascending, transverse, descending, sigmoid  colon, and rectum appeared unremarkable. Retroflexed views revealed no abnormalities. The scope was then completely withdrawn from the patient and the procedure terminated.  SCOPE WITHDRAWAL TIME: 6    ADVERSE EVENTS:      There were no immediate complications.  IMPRESSIONS:     Normal colonoscopy  RECOMMENDATIONS:     Repeat Colonscopy in 10 years. RECALL:  _____________________________ Franky Macho, MD eSigned:  Franky Macho, MD Nov 14, 2014 12:56 PM   cc:   CPT CODES: ICD CODES:  The ICD and CPT codes recommended by this software are interpretations from the data that the clinical staff has captured with the software.  The verification of the translation of this report to the ICD and CPT codes and modifiers is the sole responsibility of the health care institution and practicing physician where this report was generated.  [employer] will not be held responsible for the validity of the ICD and CPT codes included on this report.  AMA assumes no liability for data contained or not contained herein. CPT is a Publishing rights manager of the [employer].

## 2014-10-20 NOTE — Interval H&P Note (Signed)
History and Physical Interval Note:  10/20/2014 12:26 PM  Holly Hodges  has presented today for surgery, with the diagnosis of screening  The various methods of treatment have been discussed with the patient and family. After consideration of risks, benefits and other options for treatment, the patient has consented to  Procedure(s): COLONOSCOPY (N/A) as a surgical intervention .  The patient's history has been reviewed, patient examined, no change in status, stable for surgery.  I have reviewed the patient's chart and labs.  Questions were answered to the patient's satisfaction.     Franky Macho A

## 2014-10-22 ENCOUNTER — Encounter (HOSPITAL_COMMUNITY): Payer: Self-pay | Admitting: General Surgery

## 2014-12-11 ENCOUNTER — Other Ambulatory Visit: Payer: Self-pay

## 2014-12-11 DIAGNOSIS — Z1231 Encounter for screening mammogram for malignant neoplasm of breast: Secondary | ICD-10-CM

## 2015-01-05 ENCOUNTER — Ambulatory Visit: Payer: BC Managed Care – PPO

## 2015-02-25 ENCOUNTER — Ambulatory Visit
Admission: RE | Admit: 2015-02-25 | Discharge: 2015-02-25 | Disposition: A | Discharge status: Home / Self Care | Payer: BC Managed Care – PPO | Source: Ambulatory Visit

## 2015-02-25 DIAGNOSIS — Z1231 Encounter for screening mammogram for malignant neoplasm of breast: Secondary | ICD-10-CM

## 2015-02-26 ENCOUNTER — Other Ambulatory Visit: Payer: Self-pay | Admitting: General Surgery

## 2015-02-26 DIAGNOSIS — R928 Other abnormal and inconclusive findings on diagnostic imaging of breast: Secondary | ICD-10-CM

## 2015-03-05 ENCOUNTER — Ambulatory Visit
Admission: RE | Admit: 2015-03-05 | Discharge: 2015-03-05 | Disposition: A | Discharge status: Home / Self Care | Payer: BC Managed Care – PPO | Source: Ambulatory Visit | Attending: General Surgery | Admitting: General Surgery

## 2015-03-05 DIAGNOSIS — R928 Other abnormal and inconclusive findings on diagnostic imaging of breast: Secondary | ICD-10-CM

## 2016-02-29 ENCOUNTER — Other Ambulatory Visit: Payer: Self-pay | Admitting: General Surgery

## 2016-02-29 DIAGNOSIS — Z1231 Encounter for screening mammogram for malignant neoplasm of breast: Secondary | ICD-10-CM

## 2016-03-07 ENCOUNTER — Ambulatory Visit
Admission: RE | Admit: 2016-03-07 | Discharge: 2016-03-07 | Disposition: A | Discharge status: Home / Self Care | Payer: BLUE CROSS/BLUE SHIELD | Source: Ambulatory Visit | Attending: General Surgery | Admitting: General Surgery

## 2016-03-07 DIAGNOSIS — Z1231 Encounter for screening mammogram for malignant neoplasm of breast: Secondary | ICD-10-CM

## 2016-12-25 ENCOUNTER — Other Ambulatory Visit: Payer: Self-pay | Admitting: General Surgery

## 2016-12-25 DIAGNOSIS — Z1231 Encounter for screening mammogram for malignant neoplasm of breast: Secondary | ICD-10-CM

## 2017-03-08 ENCOUNTER — Ambulatory Visit
Admission: RE | Admit: 2017-03-08 | Discharge: 2017-03-08 | Disposition: A | Discharge status: Home / Self Care | Payer: BLUE CROSS/BLUE SHIELD | Source: Ambulatory Visit | Attending: General Surgery | Admitting: General Surgery

## 2017-03-08 DIAGNOSIS — Z1231 Encounter for screening mammogram for malignant neoplasm of breast: Secondary | ICD-10-CM

## 2017-04-05 DIAGNOSIS — H25813 Combined forms of age-related cataract, bilateral: Secondary | ICD-10-CM | POA: Diagnosis not present

## 2017-04-05 DIAGNOSIS — H25811 Combined forms of age-related cataract, right eye: Secondary | ICD-10-CM | POA: Diagnosis not present

## 2017-04-05 DIAGNOSIS — H52221 Regular astigmatism, right eye: Secondary | ICD-10-CM | POA: Diagnosis not present

## 2017-04-05 DIAGNOSIS — H5213 Myopia, bilateral: Secondary | ICD-10-CM | POA: Diagnosis not present

## 2017-04-06 NOTE — Patient Instructions (Signed)
Your procedure is scheduled on: 04/16/2017   Report to [facility] at  1130   AM.  Call this number if you have problems the morning of surgery: [phone]   Do not eat food or drink liquids :After Midnight.      Take these medicines the morning of surgery with A SIP OF WATER: None   Do not wear jewelry, make-up or nail polish.  Do not wear lotions, powders, or perfumes. You may wear deodorant.  Do not shave 48 hours prior to surgery.  Do not bring valuables to the hospital.  Contacts, dentures or bridgework may not be worn into surgery.  Leave suitcase in the car. After surgery it may be brought to your room.  For patients admitted to the hospital, checkout time is 11:00 AM the day of discharge.   Patients discharged the day of surgery will not be allowed to drive home.  :     Please read over the following fact sheets that you were given: Coughing and Deep Breathing, Surgical Site Infection Prevention, Anesthesia Post-op Instructions and Care and Recovery After Surgery    Cataract A cataract is a clouding of the lens of the eye. When a lens becomes cloudy, vision is reduced based on the degree and nature of the clouding. Many cataracts reduce vision to some degree. Some cataracts make people more near-sighted as they develop. Other cataracts increase glare. Cataracts that are ignored and become worse can sometimes look white. The white color can be seen through the pupil. CAUSES   Aging. However, cataracts may occur at any age, even in newborns.   Certain drugs.   Trauma to the eye.   Certain diseases such as diabetes.   Specific eye diseases such as chronic inflammation inside the eye or a sudden attack of a rare form of glaucoma.   Inherited or acquired medical problems.  SYMPTOMS   Gradual, progressive drop in vision in the affected eye.   Severe, rapid visual loss. This most often happens when trauma is the cause.  DIAGNOSIS  To detect a cataract, an eye doctor examines  the lens. Cataracts are best diagnosed with an exam of the eyes with the pupils enlarged (dilated) by drops.  TREATMENT  For an early cataract, vision may improve by using different eyeglasses or stronger lighting. If that does not help your vision, surgery is the only effective treatment. A cataract needs to be surgically removed when vision loss interferes with your everyday activities, such as driving, reading, or watching TV. A cataract may also have to be removed if it prevents examination or treatment of another eye problem. Surgery removes the cloudy lens and usually replaces it with a substitute lens (intraocular lens, IOL).  At a time when both you and your doctor agree, the cataract will be surgically removed. If you have cataracts in both eyes, only one is usually removed at a time. This allows the operated eye to heal and be out of danger from any possible problems after surgery (such as infection or poor wound healing). In rare cases, a cataract may be doing damage to your eye. In these cases, your caregiver may advise surgical removal right away. The vast majority of people who have cataract surgery have better vision afterward. HOME CARE INSTRUCTIONS  If you are not planning surgery, you may be asked to do the following:  Use different eyeglasses.   Use stronger or brighter lighting.   Ask your eye doctor about reducing your medicine  dose or changing medicines if it is thought that a medicine caused your cataract. Changing medicines does not make the cataract go away on its own.   Become familiar with your surroundings. Poor vision can lead to injury. Avoid bumping into things on the affected side. You are at a higher risk for tripping or falling.   Exercise extreme care when driving or operating machinery.   Wear sunglasses if you are sensitive to bright light or experiencing problems with glare.  SEEK IMMEDIATE MEDICAL CARE IF:   You have a worsening or sudden vision loss.    You notice redness, swelling, or increasing pain in the eye.   You have a fever.  Document Released: 02/20/2005 Document Revised: 02/09/2011 Document Reviewed: 10/14/2010 [facility] Patient Information 2012 [place].PATIENT INSTRUCTIONS POST-ANESTHESIA  IMMEDIATELY FOLLOWING SURGERY:  Do not drive or operate machinery for the first twenty four hours after surgery.  Do not make any important decisions for twenty four hours after surgery or while taking narcotic pain medications or sedatives.  If you develop intractable nausea and vomiting or a severe headache please notify your doctor immediately.  FOLLOW-UP:  Please make an appointment with your surgeon as instructed. You do not need to follow up with anesthesia unless specifically instructed to do so.  WOUND CARE INSTRUCTIONS (if applicable):  Keep a dry clean dressing on the anesthesia/puncture wound site if there is drainage.  Once the wound has quit draining you may leave it open to air.  Generally you should leave the bandage intact for twenty four hours unless there is drainage.  If the epidural site drains for more than 36-48 hours please call the anesthesia department.  QUESTIONS?:  Please feel free to call your physician or the hospital operator if you have any questions, and they will be happy to assist you.

## 2017-04-10 ENCOUNTER — Encounter (HOSPITAL_COMMUNITY)
Admission: RE | Admit: 2017-04-10 | Discharge: 2017-04-10 | Disposition: A | Discharge status: Home / Self Care | Payer: BLUE CROSS/BLUE SHIELD | Source: Ambulatory Visit | Attending: Ophthalmology | Admitting: Ophthalmology

## 2017-04-10 ENCOUNTER — Encounter (HOSPITAL_COMMUNITY): Payer: Self-pay

## 2017-04-10 ENCOUNTER — Other Ambulatory Visit: Payer: Self-pay

## 2017-04-10 DIAGNOSIS — M5440 Lumbago with sciatica, unspecified side: Secondary | ICD-10-CM | POA: Diagnosis not present

## 2017-04-10 DIAGNOSIS — Z01812 Encounter for preprocedural laboratory examination: Secondary | ICD-10-CM | POA: Diagnosis not present

## 2017-04-10 DIAGNOSIS — H269 Unspecified cataract: Secondary | ICD-10-CM | POA: Insufficient documentation

## 2017-04-10 DIAGNOSIS — D649 Anemia, unspecified: Secondary | ICD-10-CM | POA: Insufficient documentation

## 2017-04-10 DIAGNOSIS — M1990 Unspecified osteoarthritis, unspecified site: Secondary | ICD-10-CM | POA: Diagnosis not present

## 2017-04-10 LAB — CBC WITH DIFFERENTIAL/PLATELET
BASOS ABS: 0 10*3/uL (ref 0.0–0.1)
Basophils Relative: 0 %
Eosinophils Absolute: 0.1 10*3/uL (ref 0.0–0.7)
Eosinophils Relative: 2 %
HCT: 34 % — ABNORMAL LOW (ref 36.0–46.0)
HEMOGLOBIN: 11.4 g/dL — AB (ref 12.0–15.0)
LYMPHS ABS: 1.7 10*3/uL (ref 0.7–4.0)
LYMPHS PCT: 38 %
MCH: 29.5 pg (ref 26.0–34.0)
MCHC: 33.5 g/dL (ref 30.0–36.0)
MCV: 88.1 fL (ref 78.0–100.0)
Monocytes Absolute: 0.3 10*3/uL (ref 0.1–1.0)
Monocytes Relative: 6 %
NEUTROS PCT: 54 %
Neutro Abs: 2.5 10*3/uL (ref 1.7–7.7)
PLATELETS: 269 10*3/uL (ref 150–400)
RBC: 3.86 MIL/uL — AB (ref 3.87–5.11)
RDW: 12.3 % (ref 11.5–15.5)
WBC: 4.6 10*3/uL (ref 4.0–10.5)

## 2017-04-10 LAB — HCG, SERUM, QUALITATIVE: PREG SERUM: NEGATIVE

## 2017-04-16 ENCOUNTER — Ambulatory Visit (HOSPITAL_COMMUNITY)
Admission: RE | Admit: 2017-04-16 | Discharge: 2017-04-16 | Disposition: A | Discharge status: Home / Self Care | Payer: BLUE CROSS/BLUE SHIELD | Source: Ambulatory Visit | Attending: Ophthalmology | Admitting: Ophthalmology

## 2017-04-16 ENCOUNTER — Ambulatory Visit (HOSPITAL_COMMUNITY): Payer: BLUE CROSS/BLUE SHIELD | Admitting: Anesthesiology

## 2017-04-16 ENCOUNTER — Encounter (HOSPITAL_COMMUNITY)
Admission: RE | Disposition: A | Discharge status: Home / Self Care | Payer: Self-pay | Source: Ambulatory Visit | Attending: Ophthalmology

## 2017-04-16 ENCOUNTER — Encounter (HOSPITAL_COMMUNITY): Payer: Self-pay | Admitting: *Deleted

## 2017-04-16 DIAGNOSIS — H25811 Combined forms of age-related cataract, right eye: Secondary | ICD-10-CM | POA: Insufficient documentation

## 2017-04-16 DIAGNOSIS — F419 Anxiety disorder, unspecified: Secondary | ICD-10-CM | POA: Insufficient documentation

## 2017-04-16 DIAGNOSIS — D649 Anemia, unspecified: Secondary | ICD-10-CM | POA: Diagnosis not present

## 2017-04-16 DIAGNOSIS — H2511 Age-related nuclear cataract, right eye: Secondary | ICD-10-CM | POA: Diagnosis not present

## 2017-04-16 DIAGNOSIS — M1990 Unspecified osteoarthritis, unspecified site: Secondary | ICD-10-CM | POA: Diagnosis not present

## 2017-04-16 DIAGNOSIS — M5430 Sciatica, unspecified side: Secondary | ICD-10-CM | POA: Diagnosis not present

## 2017-04-16 HISTORY — PX: CATARACT EXTRACTION W/PHACO: SHX586

## 2017-04-16 SURGERY — PHACOEMULSIFICATION, CATARACT, WITH IOL INSERTION
Anesthesia: Monitor Anesthesia Care | Site: Eye | Laterality: Right

## 2017-04-16 MED ORDER — MIDAZOLAM HCL 2 MG/2ML IJ SOLN
INTRAMUSCULAR | Status: DC | PRN
  Administered 2017-04-16: 2 mg via INTRAVENOUS

## 2017-04-16 MED ORDER — TETRACAINE HCL 0.5 % OP SOLN
1.0000 [drp] | OPHTHALMIC | Status: AC
  Administered 2017-04-16 (×3): 1 [drp] via OPHTHALMIC

## 2017-04-16 MED ORDER — BSS IO SOLN
INTRAOCULAR | Status: DC | PRN
  Administered 2017-04-16: 500 mL

## 2017-04-16 MED ORDER — MIDAZOLAM HCL 2 MG/2ML IJ SOLN
1.0000 mg | INTRAMUSCULAR | Status: AC
  Administered 2017-04-16: 2 mg via INTRAVENOUS

## 2017-04-16 MED ORDER — MIDAZOLAM HCL 5 MG/5ML IJ SOLN
INTRAMUSCULAR | Status: AC
  Filled 2017-04-16: qty 5

## 2017-04-16 MED ORDER — LACTATED RINGERS IV SOLN
INTRAVENOUS | Status: DC
  Administered 2017-04-16: 1000 mL via INTRAVENOUS

## 2017-04-16 MED ORDER — LIDOCAINE HCL (PF) 1 % IJ SOLN
INTRAMUSCULAR | Status: DC | PRN
  Administered 2017-04-16: .4 mL

## 2017-04-16 MED ORDER — LIDOCAINE HCL 3.5 % OP GEL
1.0000 "application " | Freq: Once | OPHTHALMIC | Status: AC
  Administered 2017-04-16: 1 via OPHTHALMIC

## 2017-04-16 MED ORDER — EPINEPHRINE PF 1 MG/ML IJ SOLN
INTRAMUSCULAR | Status: AC
  Filled 2017-04-16: qty 1

## 2017-04-16 MED ORDER — NEOMYCIN-POLYMYXIN-DEXAMETH 3.5-10000-0.1 OP SUSP
OPHTHALMIC | Status: DC | PRN
  Administered 2017-04-16: 2 [drp] via OPHTHALMIC

## 2017-04-16 MED ORDER — CYCLOPENTOLATE-PHENYLEPHRINE 0.2-1 % OP SOLN
1.0000 [drp] | OPHTHALMIC | Status: AC
  Administered 2017-04-16 (×3): 1 [drp] via OPHTHALMIC

## 2017-04-16 MED ORDER — FENTANYL CITRATE (PF) 100 MCG/2ML IJ SOLN
25.0000 ug | Freq: Once | INTRAMUSCULAR | Status: AC
  Administered 2017-04-16: 25 ug via INTRAVENOUS

## 2017-04-16 MED ORDER — FENTANYL CITRATE (PF) 100 MCG/2ML IJ SOLN
INTRAMUSCULAR | Status: AC
  Filled 2017-04-16: qty 2

## 2017-04-16 MED ORDER — POVIDONE-IODINE 5 % OP SOLN
OPHTHALMIC | Status: DC | PRN
  Administered 2017-04-16: 1 via OPHTHALMIC

## 2017-04-16 MED ORDER — PHENYLEPHRINE HCL 2.5 % OP SOLN
1.0000 [drp] | OPHTHALMIC | Status: AC
  Administered 2017-04-16 (×3): 1 [drp] via OPHTHALMIC

## 2017-04-16 MED ORDER — PROVISC 10 MG/ML IO SOLN
INTRAOCULAR | Status: DC | PRN
Start: 2017-04-16 — End: 2017-04-16
  Administered 2017-04-16: 0.85 mL via INTRAOCULAR

## 2017-04-16 MED ORDER — MIDAZOLAM HCL 2 MG/2ML IJ SOLN
INTRAMUSCULAR | Status: AC
  Filled 2017-04-16: qty 2

## 2017-04-16 SURGICAL SUPPLY — 12 items
CLOTH BEACON ORANGE TIMEOUT ST (SAFETY) ×1 IMPLANT
EYE SHIELD UNIVERSAL CLEAR (GAUZE/BANDAGES/DRESSINGS) ×1 IMPLANT
GLOVE BIOGEL PI IND STRL 6.5 (GLOVE) IMPLANT
GLOVE BIOGEL PI IND STRL 7.0 (GLOVE) IMPLANT
GLOVE BIOGEL PI INDICATOR 6.5 (GLOVE) ×1
GLOVE BIOGEL PI INDICATOR 7.0 (GLOVE) ×1
LENS ALC ACRYL/TECN (Ophthalmic Related) ×1 IMPLANT
PAD ARMBOARD 7.5X6 YLW CONV (MISCELLANEOUS) ×1 IMPLANT
SYRINGE LUER LOK 1CC (MISCELLANEOUS) ×1 IMPLANT
TAPE SURG TRANSPORE 1 IN (GAUZE/BANDAGES/DRESSINGS) IMPLANT
TAPE SURGICAL TRANSPORE 1 IN (GAUZE/BANDAGES/DRESSINGS) ×1
WATER STERILE IRR 250ML POUR (IV SOLUTION) ×1 IMPLANT

## 2017-04-16 NOTE — Transfer of Care (Signed)
Immediate Anesthesia Transfer of Care Note  Patient: Holly Hodges  Procedure(s) Performed: CATARACT EXTRACTION PHACO AND INTRAOCULAR LENS PLACEMENT RIGHT EYE (Right Eye)  Patient Location: Short Stay  Anesthesia Type:MAC  Level of Consciousness: awake, alert  and patient cooperative  Airway & Oxygen Therapy: Patient Spontanous Breathing  Post-op Assessment: Report given to RN and Post -op Vital signs reviewed and stable  Post vital signs: Reviewed and stable  Last Vitals:  Vitals:   04/16/17 1215 04/16/17 1220  BP: 118/76 124/74  Pulse:    Resp: (!) 23 (!) 24  Temp:    SpO2: 100% 100%    Last Pain:  Vitals:   04/16/17 1116  TempSrc: Oral      Patients Stated Pain Goal: 8 (53/97/67 3419)  Complications: No apparent anesthesia complications

## 2017-04-16 NOTE — Anesthesia Preprocedure Evaluation (Signed)
Anesthesia Evaluation  Patient identified by MRN, date of birth, ID band Patient awake    Reviewed: Allergy & Precautions, NPO status , Patient's Chart, lab work & pertinent test results  Airway Mallampati: II  TM Distance: >3 FB Neck ROM: Full    Dental  (+) Teeth Intact   Pulmonary neg pulmonary ROS,    breath sounds clear to auscultation       Cardiovascular negative cardio ROS   Rhythm:Regular Rate:Normal     Neuro/Psych  Neuromuscular disease (sciatica) negative psych ROS   GI/Hepatic negative GI ROS, Neg liver ROS,   Endo/Other    Renal/GU negative Renal ROS     Musculoskeletal  (+) Arthritis ,   Abdominal   Peds  Hematology  (+) Blood dyscrasia, anemia ,   Anesthesia Other Findings   Reproductive/Obstetrics                             Anesthesia Physical Anesthesia Plan  ASA: II  Anesthesia Plan: MAC   Post-op Pain Management:    Induction: Intravenous  PONV Risk Score and Plan:   Airway Management Planned: Nasal Cannula  Additional Equipment:   Intra-op Plan:   Post-operative Plan:   Informed Consent: I have reviewed the patients History and Physical, chart, labs and discussed the procedure including the risks, benefits and alternatives for the proposed anesthesia with the patient or authorized representative who has indicated his/her understanding and acceptance.     Plan Discussed with:   Anesthesia Plan Comments:         Anesthesia Quick Evaluation

## 2017-04-16 NOTE — Anesthesia Postprocedure Evaluation (Signed)
Anesthesia Post Note  Patient: Holly Hodges  Procedure(s) Performed: CATARACT EXTRACTION PHACO AND INTRAOCULAR LENS PLACEMENT RIGHT EYE (Right Eye)  Patient location during evaluation: Short Stay Anesthesia Type: MAC Level of consciousness: awake and alert and patient cooperative Pain management: pain level controlled Vital Signs Assessment: post-procedure vital signs reviewed and stable Respiratory status: spontaneous breathing Cardiovascular status: stable Postop Assessment: no apparent nausea or vomiting Anesthetic complications: no     Last Vitals:  Vitals:   04/16/17 1215 04/16/17 1220  BP: 118/76 124/74  Pulse:    Resp: (!) 23 (!) 24  Temp:    SpO2: 100% 100%    Last Pain:  Vitals:   04/16/17 1116  TempSrc: Oral                 Joslyn Ramos

## 2017-04-16 NOTE — Progress Notes (Signed)
Please excuse Jannifer Hicknn Nagele from work on 04/16/17 and 04/17/17.  She had a procedure at [facility].  Thank you!  Armando Gangracy Bode Pieper RN

## 2017-04-16 NOTE — Anesthesia Procedure Notes (Signed)
Procedure Name: MAC Date/Time: 04/16/2017 12:23 PM Performed by: Vista Deck, CRNA Pre-anesthesia Checklist: Patient identified, Emergency Drugs available, Suction available, Timeout performed and Patient being monitored Patient Re-evaluated:Patient Re-evaluated prior to induction Oxygen Delivery Method: Nasal Cannula

## 2017-04-16 NOTE — Op Note (Signed)
Date of Admission: 04/16/2017  Date of Surgery: 04/16/2017  Pre-Op Dx: Cataract Right  Eye  Post-Op Dx: Senile Combined Cataract  Right  Eye,  Dx Code L49.179  Surgeon: Tonny Branch, M.D.  Assistants: None  Anesthesia: Topical with MAC  Indications: Painless, progressive loss of vision with compromise of daily activities.  Surgery: Cataract Extraction with Intraocular lens Implant Right Eye  Discription: The patient had dilating drops and viscous lidocaine placed into the Right eye in the pre-op holding area. After transfer to the operating room, a time out was performed. The patient was then prepped and draped. Beginning with a 32m blade a paracentesis port was made at the surgeon's 2 o'clock position. The anterior chamber was then filled with 1% non-preserved lidocaine. This was followed by filling the anterior chamber with Provisc.  A 2.439mkeratome blade was used to make a clear corneal incision at the temporal limbus.  A bent cystatome needle was used to create a continuous tear capsulotomy. Hydrodissection was performed with balanced salt solution on a Fine canula. The lens nucleus was then removed using the phacoemulsification handpiece. Residual cortex was removed with the I&A handpiece. The anterior chamber and capsular bag were refilled with Provisc. A posterior chamber intraocular lens was placed into the capsular bag with it's injector. The implant was positioned with the Kuglan hook. The Provisc was then removed from the anterior chamber and capsular bag with the I&A handpiece. Stromal hydration of the main incision and paracentesis port was performed with BSS on a Fine canula. The wounds were tested for leak which was negative. The patient tolerated the procedure well. There were no operative complications. The patient was then transferred to the recovery room in stable condition.  Complications: None  Specimen: None  EBL: None  Prosthetic device: J&J Technis, PCB00, power 9.5, SN  [id number]

## 2017-04-16 NOTE — Discharge Instructions (Signed)

## 2017-04-16 NOTE — H&P (Signed)
I have reviewed the H&P, the patient was re-examined, and I have identified no interval changes in medical condition and plan of care since the history and physical of record  

## 2017-04-17 ENCOUNTER — Encounter (HOSPITAL_COMMUNITY): Payer: Self-pay | Admitting: Ophthalmology

## 2017-05-29 DIAGNOSIS — Z124 Encounter for screening for malignant neoplasm of cervix: Secondary | ICD-10-CM | POA: Diagnosis not present

## 2017-05-29 DIAGNOSIS — R87610 Atypical squamous cells of undetermined significance on cytologic smear of cervix (ASC-US): Secondary | ICD-10-CM | POA: Diagnosis not present

## 2017-05-29 DIAGNOSIS — Z01419 Encounter for gynecological examination (general) (routine) without abnormal findings: Secondary | ICD-10-CM | POA: Diagnosis not present

## 2017-05-29 DIAGNOSIS — N959 Unspecified menopausal and perimenopausal disorder: Secondary | ICD-10-CM | POA: Diagnosis not present

## 2017-05-29 DIAGNOSIS — Z6824 Body mass index (BMI) 24.0-24.9, adult: Secondary | ICD-10-CM | POA: Diagnosis not present

## 2017-08-14 DIAGNOSIS — F419 Anxiety disorder, unspecified: Secondary | ICD-10-CM | POA: Diagnosis not present

## 2017-08-21 DIAGNOSIS — F419 Anxiety disorder, unspecified: Secondary | ICD-10-CM | POA: Diagnosis not present

## 2017-08-27 DIAGNOSIS — F419 Anxiety disorder, unspecified: Secondary | ICD-10-CM | POA: Diagnosis not present

## 2017-09-03 DIAGNOSIS — F419 Anxiety disorder, unspecified: Secondary | ICD-10-CM | POA: Diagnosis not present

## 2018-01-04 DIAGNOSIS — E782 Mixed hyperlipidemia: Secondary | ICD-10-CM | POA: Diagnosis not present

## 2018-01-04 DIAGNOSIS — Z6823 Body mass index (BMI) 23.0-23.9, adult: Secondary | ICD-10-CM | POA: Diagnosis not present

## 2018-01-04 DIAGNOSIS — Z0000 Encounter for general adult medical examination without abnormal findings: Secondary | ICD-10-CM | POA: Diagnosis not present

## 2018-01-04 DIAGNOSIS — F419 Anxiety disorder, unspecified: Secondary | ICD-10-CM | POA: Diagnosis not present

## 2018-01-04 DIAGNOSIS — Z1389 Encounter for screening for other disorder: Secondary | ICD-10-CM | POA: Diagnosis not present

## 2018-01-04 DIAGNOSIS — Z23 Encounter for immunization: Secondary | ICD-10-CM | POA: Diagnosis not present

## 2018-01-04 DIAGNOSIS — D649 Anemia, unspecified: Secondary | ICD-10-CM | POA: Diagnosis not present

## 2018-01-07 DIAGNOSIS — Z0000 Encounter for general adult medical examination without abnormal findings: Secondary | ICD-10-CM | POA: Diagnosis not present

## 2018-01-07 DIAGNOSIS — Z23 Encounter for immunization: Secondary | ICD-10-CM | POA: Diagnosis not present

## 2018-02-28 ENCOUNTER — Other Ambulatory Visit: Payer: Self-pay | Admitting: Family Medicine

## 2018-02-28 DIAGNOSIS — Z1231 Encounter for screening mammogram for malignant neoplasm of breast: Secondary | ICD-10-CM

## 2018-04-08 ENCOUNTER — Ambulatory Visit
Admission: RE | Admit: 2018-04-08 | Discharge: 2018-04-08 | Disposition: A | Discharge status: Home / Self Care | Payer: BLUE CROSS/BLUE SHIELD | Source: Ambulatory Visit | Attending: Family Medicine | Admitting: Family Medicine

## 2018-04-08 DIAGNOSIS — Z1231 Encounter for screening mammogram for malignant neoplasm of breast: Secondary | ICD-10-CM | POA: Diagnosis not present

## 2018-10-04 ENCOUNTER — Other Ambulatory Visit: Payer: Self-pay

## 2018-10-04 ENCOUNTER — Emergency Department (HOSPITAL_COMMUNITY)
Admission: EM | Admit: 2018-10-04 | Discharge: 2018-10-04 | Disposition: A | Discharge status: Home / Self Care | Payer: BC Managed Care – PPO | Attending: Emergency Medicine | Admitting: Emergency Medicine

## 2018-10-04 ENCOUNTER — Emergency Department (HOSPITAL_COMMUNITY): Payer: BC Managed Care – PPO

## 2018-10-04 ENCOUNTER — Encounter (HOSPITAL_COMMUNITY): Payer: Self-pay

## 2018-10-04 DIAGNOSIS — R0789 Other chest pain: Secondary | ICD-10-CM | POA: Diagnosis not present

## 2018-10-04 DIAGNOSIS — R079 Chest pain, unspecified: Secondary | ICD-10-CM | POA: Diagnosis not present

## 2018-10-04 LAB — BASIC METABOLIC PANEL
Anion gap: 8 (ref 5–15)
BUN: 8 mg/dL (ref 6–20)
CO2: 26 mmol/L (ref 22–32)
Calcium: 9.5 mg/dL (ref 8.9–10.3)
Chloride: 105 mmol/L (ref 98–111)
Creatinine, Ser: 0.81 mg/dL (ref 0.44–1.00)
GFR calc Af Amer: >60 mL/min (ref >60)
GFR calc non Af Amer: >60 mL/min (ref >60)
Glucose, Bld: 98 mg/dL (ref 70–99)
Potassium: 3.9 mmol/L (ref 3.5–5.1)
Sodium: 139 mmol/L (ref 135–145)

## 2018-10-04 LAB — CBC
HCT: 36.8 % (ref 36.0–46.0)
Hemoglobin: 12 g/dL (ref 12.0–15.0)
MCH: 29.3 pg (ref 26.0–34.0)
MCHC: 32.6 g/dL (ref 30.0–36.0)
MCV: 89.8 fL (ref 80.0–100.0)
Platelets: 272 10*3/uL (ref 150–400)
RBC: 4.1 MIL/uL (ref 3.87–5.11)
RDW: 12.3 % (ref 11.5–15.5)
WBC: 4.2 10*3/uL (ref 4.0–10.5)
nRBC: 0 % (ref 0.0–0.2)

## 2018-10-04 LAB — TROPONIN I (HIGH SENSITIVITY): Troponin I (High Sensitivity): <2 ng/L (ref <18)

## 2018-10-04 NOTE — Discharge Instructions (Addendum)
You were evaluated in the Emergency Department and after careful evaluation, we did not find any emergent condition requiring admission or further testing in the hospital.  Your testing today is reassuring with no signs of heart damage.  Muscle strain is the most likely cause of your chest pain, however we still recommend following up with your primary care doctor to discuss your symptoms and discuss the need for outpatient stress testing.  We recommend increasing your fluid intake during the day to aid with your headaches.  Please return to the Emergency Department if you experience any worsening of your condition.  We encourage you to follow up with a primary care provider.  Thank you for allowing [place] to be a part of your care.

## 2018-10-04 NOTE — ED Triage Notes (Signed)
Pt reports central chest pain between breast. Pain has been intermittent . Pt reports that she had been doing push ups and contributed pain to that. Told to come to ED per EDP. Also reports HA that began tuesday

## 2018-10-04 NOTE — ED Provider Notes (Signed)
[facility] Emergency Department Provider Note MRN:  [id number]  Arrival date & time: 10/04/18     Chief Complaint   Chest Pain and Headache   History of Present Illness   Holly Hodges is a 53 y.o. year-old female with no pertinent past medical history presenting to the ED with chief complaint of chest pain and headache.  Intermittent headache for the past 3 days, gradual onset, frontal, sharp.  Currently without headache.  Also with intermittent chest pain for the past 2 days.  Described as an aching central chest pain, nonexertional, seems random, thinks it may have been related to her recent increased workout regimen.  Denies fever, no cough, no dizziness, no diaphoresis, no nausea, no vomiting, no shortness of breath, no leg pain or swelling.  Currently without chest pain.  Review of Systems  A complete 10 system review of systems was obtained and all systems are negative except as noted in the HPI and PMH.   Patient's Health History    Past Medical History:  Diagnosis Date  . Anemia     Past Surgical History:  Procedure Laterality Date  . BREAST BIOPSY    . BREAST EXCISIONAL BIOPSY Bilateral 98,03,05   x3 benign  . BREAST LUMPECTOMY     X 3-All benign  . CATARACT EXTRACTION W/PHACO Right 04/16/2017   Procedure: CATARACT EXTRACTION PHACO AND INTRAOCULAR LENS PLACEMENT RIGHT EYE;  Surgeon: Tonny Branch, MD;  Location: [facility];  Service: Ophthalmology;  Laterality: Right;  CDE: 5.52  . COLONOSCOPY N/A 10/20/2014   Procedure: COLONOSCOPY;  Surgeon: Aviva Signs, MD;  Location: [facility];  Service: Gastroenterology;  Laterality: N/A;  . HEMORRHOID SURGERY      No family history on file.  Social History   Socioeconomic History  . Marital status: Single    Spouse name: Not on file  . Number of children: Not on file  . Years of education: Not on file  . Highest education level: Not on file  Occupational History  . Not on file  Social Needs  .  Financial resource strain: Not on file  . Food insecurity    Worry: Not on file    Inability: Not on file  . Transportation needs    Medical: Not on file    Non-medical: Not on file  Tobacco Use  . Smoking status: Never Smoker  . Smokeless tobacco: Never Used  Substance and Sexual Activity  . Alcohol use: No  . Drug use: No  . Sexual activity: Yes    Birth control/protection: None  Lifestyle  . Physical activity    Days per week: Not on file    Minutes per session: Not on file  . Stress: Not on file  Relationships  . Social Herbalist on phone: Not on file    Gets together: Not on file    Attends religious service: Not on file    Active member of club or organization: Not on file    Attends meetings of clubs or organizations: Not on file    Relationship status: Not on file  . Intimate partner violence    Fear of current or ex partner: Not on file    Emotionally abused: Not on file    Physically abused: Not on file    Forced sexual activity: Not on file  Other Topics Concern  . Not on file  Social History Narrative  . Not on file     Physical  Exam  Vital Signs and Nursing Notes reviewed Vitals:   10/04/18 0811  BP: 135/83  Pulse: 80  Resp: (!) 29  Temp: 98.4 F (36.9 C)  SpO2: 100%    CONSTITUTIONAL: Well-appearing, NAD NEURO:  Alert and oriented x 3, no focal deficits EYES:  eyes equal and reactive ENT/NECK:  no LAD, no JVD CARDIO: Regular rate, well-perfused, normal S1 and S2 PULM:  CTAB no wheezing or rhonchi GI/GU:  normal bowel sounds, non-distended, non-tender MSK/SPINE:  No gross deformities, no edema SKIN:  no rash, atraumatic PSYCH:  Appropriate speech and behavior  Diagnostic and Interventional Summary    EKG Interpretation  Date/Time:  Friday October 04 2018 08:12:27 EDT Ventricular Rate:  79 PR Interval:    QRS Duration: 88 QT Interval:  369 QTC Calculation: 423 R Axis:   56 Text Interpretation:  Sinus rhythm Consider left  atrial enlargement Confirmed by Kennis CarinaBero,  [phone]([id number]) on 10/04/2018 8:18:48 AM      Labs Reviewed  CBC  BASIC METABOLIC PANEL  TROPONIN I (HIGH SENSITIVITY)    DG Chest Port 1 View  Final Result      Medications - No data to display   Procedures Critical Care  ED Course and Medical Decision Making  I have reviewed the triage vital signs and the nursing notes.  Pertinent labs & imaging results that were available during my care of the patient were reviewed by me and considered in my medical decision making (see below for details).  Considering ACS but favoring noncardiac etiology in this 53 year old female with little to no heart disease risk factors.  EKG is without acute concerns, troponin is pending.  Headache seems benign given patient's normal neurological exam, no headache currently, likely related to dehydration and/or stress given patient's increased workout regimen and recent death of her mother.  Shared decision-making was utilized given that patient is experiencing headaches which is abnormal for her and she is 53 years old.  Patient prefers keeping an eye on her symptoms and drinking more water at home, not interested in CT imaging today.  Work-up is unrevealing, troponin negative, and per hour high-sensitivity troponin protocol and her low heart score she is appropriate for discharge after single troponin.  Advised PCP follow-up to discuss outpatient stress testing.  After the discussed management above, the patient was determined to be safe for discharge.  The patient was in agreement with this plan and all questions regarding their care were answered.  ED return precautions were discussed and the patient will return to the ED with any significant worsening of condition.  Elmer Sow M. Pilar PlateBero, MD [email or web address] [email or web address]  Final Clinical Impressions(s) / ED Diagnoses     ICD-10-CM   1. Chest pain  R07.9 DG [facility]    [facility]    ED Discharge Orders    None         Sabas SousBero,  M, MD 10/04/18 [phone]

## 2018-12-26 DIAGNOSIS — H2512 Age-related nuclear cataract, left eye: Secondary | ICD-10-CM | POA: Diagnosis not present

## 2018-12-26 DIAGNOSIS — H25812 Combined forms of age-related cataract, left eye: Secondary | ICD-10-CM | POA: Diagnosis not present

## 2019-03-17 ENCOUNTER — Other Ambulatory Visit: Payer: Self-pay | Admitting: Family Medicine

## 2019-03-17 DIAGNOSIS — Z1231 Encounter for screening mammogram for malignant neoplasm of breast: Secondary | ICD-10-CM

## 2019-04-22 ENCOUNTER — Ambulatory Visit: Payer: BC Managed Care – PPO

## 2019-05-23 ENCOUNTER — Ambulatory Visit: Payer: BC Managed Care – PPO

## 2019-06-09 DIAGNOSIS — E7849 Other hyperlipidemia: Secondary | ICD-10-CM | POA: Diagnosis not present

## 2019-06-09 DIAGNOSIS — Z1389 Encounter for screening for other disorder: Secondary | ICD-10-CM | POA: Diagnosis not present

## 2019-06-09 DIAGNOSIS — E559 Vitamin D deficiency, unspecified: Secondary | ICD-10-CM | POA: Diagnosis not present

## 2019-06-09 DIAGNOSIS — F419 Anxiety disorder, unspecified: Secondary | ICD-10-CM | POA: Diagnosis not present

## 2019-06-09 DIAGNOSIS — D649 Anemia, unspecified: Secondary | ICD-10-CM | POA: Diagnosis not present

## 2019-06-09 DIAGNOSIS — Z6824 Body mass index (BMI) 24.0-24.9, adult: Secondary | ICD-10-CM | POA: Diagnosis not present

## 2019-06-09 DIAGNOSIS — R5383 Other fatigue: Secondary | ICD-10-CM | POA: Diagnosis not present

## 2019-06-09 DIAGNOSIS — L821 Other seborrheic keratosis: Secondary | ICD-10-CM | POA: Diagnosis not present

## 2019-06-16 ENCOUNTER — Ambulatory Visit
Admission: RE | Admit: 2019-06-16 | Discharge: 2019-06-16 | Disposition: A | Discharge status: Home / Self Care | Payer: BC Managed Care – PPO | Source: Ambulatory Visit | Attending: Family Medicine | Admitting: Family Medicine

## 2019-06-16 ENCOUNTER — Other Ambulatory Visit: Payer: Self-pay

## 2019-06-16 DIAGNOSIS — Z1231 Encounter for screening mammogram for malignant neoplasm of breast: Secondary | ICD-10-CM | POA: Diagnosis not present

## 2019-07-15 DIAGNOSIS — R748 Abnormal levels of other serum enzymes: Secondary | ICD-10-CM | POA: Diagnosis not present

## 2019-08-08 DIAGNOSIS — Z6825 Body mass index (BMI) 25.0-25.9, adult: Secondary | ICD-10-CM | POA: Diagnosis not present

## 2019-08-08 DIAGNOSIS — Z124 Encounter for screening for malignant neoplasm of cervix: Secondary | ICD-10-CM | POA: Diagnosis not present

## 2019-08-08 DIAGNOSIS — Z01419 Encounter for gynecological examination (general) (routine) without abnormal findings: Secondary | ICD-10-CM | POA: Diagnosis not present

## 2019-12-06 DIAGNOSIS — F411 Generalized anxiety disorder: Secondary | ICD-10-CM | POA: Diagnosis not present

## 2019-12-22 DIAGNOSIS — Z6825 Body mass index (BMI) 25.0-25.9, adult: Secondary | ICD-10-CM | POA: Diagnosis not present

## 2019-12-22 DIAGNOSIS — Z566 Other physical and mental strain related to work: Secondary | ICD-10-CM | POA: Diagnosis not present

## 2019-12-22 DIAGNOSIS — B351 Tinea unguium: Secondary | ICD-10-CM | POA: Diagnosis not present

## 2019-12-22 DIAGNOSIS — Z23 Encounter for immunization: Secondary | ICD-10-CM | POA: Diagnosis not present

## 2019-12-22 DIAGNOSIS — E559 Vitamin D deficiency, unspecified: Secondary | ICD-10-CM | POA: Diagnosis not present

## 2019-12-27 DIAGNOSIS — F411 Generalized anxiety disorder: Secondary | ICD-10-CM | POA: Diagnosis not present

## 2020-02-08 DIAGNOSIS — F411 Generalized anxiety disorder: Secondary | ICD-10-CM | POA: Diagnosis not present

## 2020-04-29 ENCOUNTER — Encounter: Payer: Self-pay | Admitting: Orthopedic Surgery

## 2020-04-29 ENCOUNTER — Other Ambulatory Visit: Payer: Self-pay

## 2020-04-29 ENCOUNTER — Ambulatory Visit: Payer: BC Managed Care – PPO | Admitting: Orthopedic Surgery

## 2020-04-29 ENCOUNTER — Ambulatory Visit: Payer: BC Managed Care – PPO

## 2020-04-29 VITALS — BP 139/85 | HR 99 | Ht 67.0 in | Wt 165.0 lb

## 2020-04-29 DIAGNOSIS — G8929 Other chronic pain: Secondary | ICD-10-CM

## 2020-04-29 DIAGNOSIS — M25572 Pain in left ankle and joints of left foot: Secondary | ICD-10-CM

## 2020-04-29 DIAGNOSIS — M659 Synovitis and tenosynovitis, unspecified: Secondary | ICD-10-CM | POA: Diagnosis not present

## 2020-04-29 DIAGNOSIS — M25562 Pain in left knee: Secondary | ICD-10-CM | POA: Diagnosis not present

## 2020-04-29 MED ORDER — MELOXICAM 7.5 MG PO TABS: 7.5000 mg | ORAL_TABLET | Freq: Every day | ORAL | 1 refills | Status: DC

## 2020-04-29 NOTE — Progress Notes (Signed)
Holly Hodges  04/29/2020  Body mass index is 25.84 kg/m.  ASSESSMENT AND PLAN:     Encounter Diagnoses  Name Primary?  . Chronic pain of left knee Yes  . Pain in left ankle and joints of left foot   . Synovitis of left ankle   . Anterior knee pain, left    Unspecified synovitis of the ankle joint should respond to NSAIDs  Patient should probably not be a runner now but continue cycling with ice Aspercreme and anti-inflammatories.   Encounter Diagnoses  Name Primary?  . Chronic pain of left knee Yes  . Pain in left ankle and joints of left foot   . Synovitis of left ankle   . Anterior knee pain, left     Imaging Images of the ankle were normal images of the knee show mild degenerative changes nothing to significant  Plan:  (Rx., Inj., surg., Frx, MRI/CT, XR:2)  Ice Aspercreme over the lateral proximal knee avoid running for exercise okay to do biking walking treadmill    HISTORY SECTION :  Chief Complaint  Patient presents with  . Knee Pain    Left for at least a year   . Ankle Pain    Left / recently few weeks    55 year old female chronic left anterior knee pain hurts when she does the stairs tries to run clicks and pops she is also having a 2-week history of pain on the anterior portion of the ankle  She started her cycling classes about 6 months ago may or may not be related to this musculoskeletal pain that she is having  She uses Aspercreme which seems to help Review of Systems  Constitutional: Negative for chills and fever.  Skin: Negative.   Neurological: Negative for tingling.     has a past medical history of Anemia.   Past Surgical History:  Procedure Laterality Date  . BREAST BIOPSY    . BREAST EXCISIONAL BIOPSY Bilateral 98,03,05   x3 benign  . BREAST LUMPECTOMY     X 3-All benign  . CATARACT EXTRACTION W/PHACO Right 04/16/2017   Procedure: CATARACT EXTRACTION PHACO AND INTRAOCULAR LENS PLACEMENT RIGHT EYE;  Surgeon: Gemma Payor, MD;   Location: [facility];  Service: Ophthalmology;  Laterality: Right;  CDE: 5.52  . COLONOSCOPY N/A 10/20/2014   Procedure: COLONOSCOPY;  Surgeon: Franky Macho, MD;  Location: [facility];  Service: Gastroenterology;  Laterality: N/A;  . HEMORRHOID SURGERY      Social History   Tobacco Use  . Smoking status: Never Smoker  . Smokeless tobacco: Never Used  Vaping Use  . Vaping Use: Never used  Substance Use Topics  . Alcohol use: No  . Drug use: No    Family History  Problem Relation Age of Onset  . Alzheimer's disease Mother       No Known Allergies   Current Outpatient Medications:  .  ibuprofen (ADVIL,MOTRIN) 200 MG tablet, Take 200 mg by mouth daily as needed for headache or moderate pain., Disp: , Rfl:  .  meloxicam (MOBIC) 7.5 MG tablet, Take 1 tablet (7.5 mg total) by mouth daily., Disp: 30 tablet, Rfl: 1   PHYSICAL EXAM SECTION: BP 139/85   Pulse 99   Ht 5\' 7"  (1.702 m)   Wt 165 lb (74.8 kg)   LMP 09/07/2016 (Approximate)   BMI 25.84 kg/m   Body mass index is 25.84 kg/m.   General appearance: Well-developed well-nourished no gross deformities  Eyes clear normal vision no  evidence of conjunctivitis or jaundice, extraocular muscles intact  ENT: ears hearing normal,  Lymph nodes: No lymphadenopathy  Neck is supple  Cardiovascular normal pulse and perfusion in all 4 extremities normal color without edema  Neurologically  no sensation loss or deficits no pathologic reflexes  Psychological: Awake alert and oriented x3 mood and affect normal  Skin no lacerations or ulcerations no nodularity no palpable masses, no erythema or nodularity  Musculoskeletal:  Left ankle no swelling but she is tender over the anterior joint line ankle is stable alignment is normal neurovascular exam is intact  Left knee she has patellofemoral crepitance pain in the medial facet and surrounding soft tissue full range of motion ligaments are stable meniscal signs are  negative   Meds ordered this encounter  Medications  . meloxicam (MOBIC) 7.5 MG tablet    Sig: Take 1 tablet (7.5 mg total) by mouth daily.    Dispense:  30 tablet    Refill:  1     2:31 PM

## 2020-04-29 NOTE — Patient Instructions (Signed)
Ice after cycling  Continue aspercreme  Start meloxicam , take 4 weeks

## 2020-06-08 ENCOUNTER — Other Ambulatory Visit: Payer: Self-pay | Admitting: General Surgery

## 2020-06-08 DIAGNOSIS — Z1231 Encounter for screening mammogram for malignant neoplasm of breast: Secondary | ICD-10-CM

## 2020-06-28 DIAGNOSIS — J029 Acute pharyngitis, unspecified: Secondary | ICD-10-CM | POA: Diagnosis not present

## 2020-06-28 DIAGNOSIS — K1379 Other lesions of oral mucosa: Secondary | ICD-10-CM | POA: Diagnosis not present

## 2020-07-01 DIAGNOSIS — Z6826 Body mass index (BMI) 26.0-26.9, adult: Secondary | ICD-10-CM | POA: Diagnosis not present

## 2020-07-01 DIAGNOSIS — Z1389 Encounter for screening for other disorder: Secondary | ICD-10-CM | POA: Diagnosis not present

## 2020-07-01 DIAGNOSIS — Z91018 Allergy to other foods: Secondary | ICD-10-CM | POA: Diagnosis not present

## 2020-07-01 DIAGNOSIS — T7840XD Allergy, unspecified, subsequent encounter: Secondary | ICD-10-CM | POA: Diagnosis not present

## 2020-07-01 DIAGNOSIS — Z1331 Encounter for screening for depression: Secondary | ICD-10-CM | POA: Diagnosis not present

## 2020-07-01 DIAGNOSIS — E663 Overweight: Secondary | ICD-10-CM | POA: Diagnosis not present

## 2020-08-03 ENCOUNTER — Other Ambulatory Visit: Payer: Self-pay

## 2020-08-03 ENCOUNTER — Ambulatory Visit
Admission: RE | Admit: 2020-08-03 | Discharge: 2020-08-03 | Disposition: A | Discharge status: Home / Self Care | Payer: BC Managed Care – PPO | Source: Ambulatory Visit | Attending: General Surgery | Admitting: General Surgery

## 2020-08-03 DIAGNOSIS — Z1231 Encounter for screening mammogram for malignant neoplasm of breast: Secondary | ICD-10-CM | POA: Diagnosis not present

## 2020-10-11 ENCOUNTER — Other Ambulatory Visit: Payer: Self-pay

## 2020-10-11 ENCOUNTER — Encounter: Payer: Self-pay | Admitting: Orthopedic Surgery

## 2020-10-11 ENCOUNTER — Ambulatory Visit: Payer: BC Managed Care – PPO | Admitting: Orthopedic Surgery

## 2020-10-11 VITALS — BP 134/92 | HR 88 | Ht 67.0 in | Wt 177.0 lb

## 2020-10-11 DIAGNOSIS — M25562 Pain in left knee: Secondary | ICD-10-CM | POA: Diagnosis not present

## 2020-10-11 DIAGNOSIS — M25462 Effusion, left knee: Secondary | ICD-10-CM

## 2020-10-11 NOTE — Progress Notes (Addendum)
Chief Complaint  Patient presents with   Knee Pain    Left, limping and pain after fit 30workout   55 year-old female with chronic left knee pain  The patient went to a new fitness class did a lot of running and jumping and started having acute left knee pain and swelling causing her to limp she presents for evaluation and management  No fever  Exam focused to the left knee small joint effusion she can fully straighten fully flex but is painful knee is stable meniscal signs are negative  Impression effusion acute knee pain  Encounter Diagnoses  Name Primary?   Effusion of knee joint, left Yes   Acute pain of left knee     Acute left knee pain, injection,  Procedure note  Injection  Verbal consent was obtained to inject the left knee after aspiration  Timeout procedure was completed to confirm injection site  Diagnosis left knee joint effusion  Medications used Celestone 6 mg Lidocaine 1% plain 3 cc  Anesthesia was provided by ethyl chloride spray  Prep was performed with alcohol  Technique of injection and aspiration  The leg was prepped left knee with alcohol and ethyl chloride and then a lateral approach was used to draw approximately 4 to 5 cc of fluid from the joint and then it was injected with the medication  No complications were noted

## 2020-10-11 NOTE — Patient Instructions (Signed)
Avoid exercising for the next 7 days  Use ice and ibuprofen daily  Change exercise routine such as stationary bike, elliptical trainer swimming walking

## 2021-01-04 DIAGNOSIS — R059 Cough, unspecified: Secondary | ICD-10-CM | POA: Diagnosis not present

## 2021-01-04 DIAGNOSIS — J069 Acute upper respiratory infection, unspecified: Secondary | ICD-10-CM | POA: Diagnosis not present

## 2021-02-25 IMAGING — MG DIGITAL SCREENING BILAT W/ TOMO W/ CAD
6 of 10 series · 6 of 30 positions shown · non-contrast
Comparison: Previous exam(s).

CLINICAL DATA: Screening.

EXAM:
DIGITAL SCREENING BILATERAL MAMMOGRAM WITH TOMO AND CAD

[L MLO synth-2D (1 of 2)]
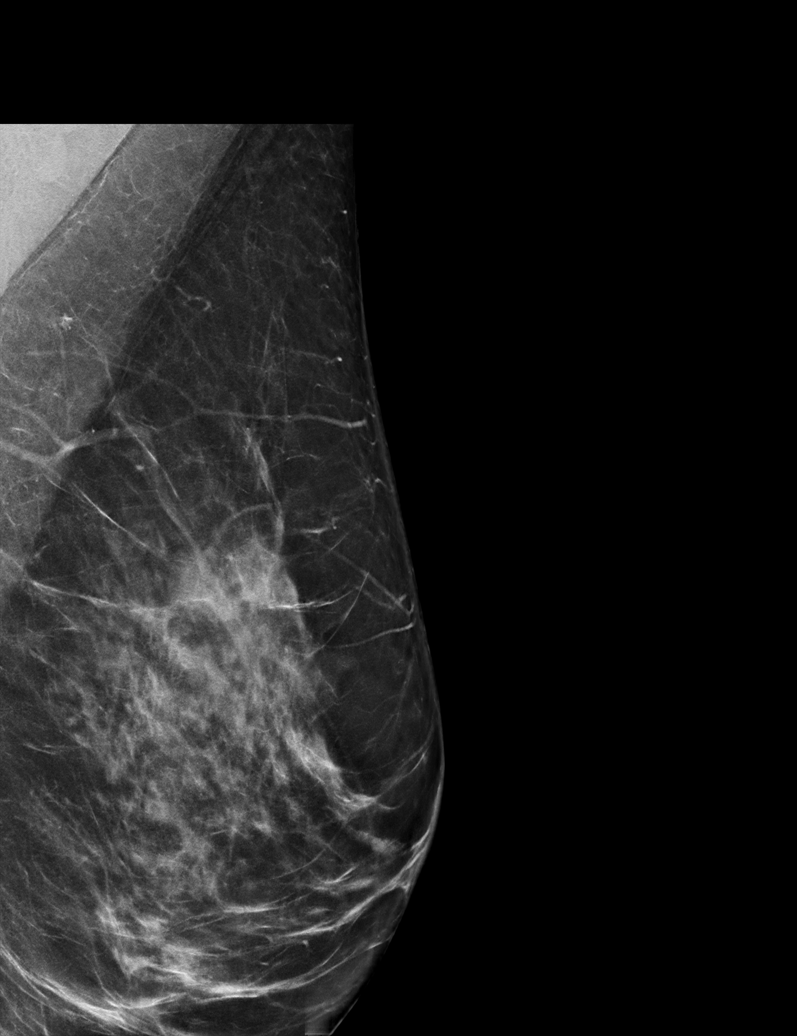

[R MLO synth-2D]
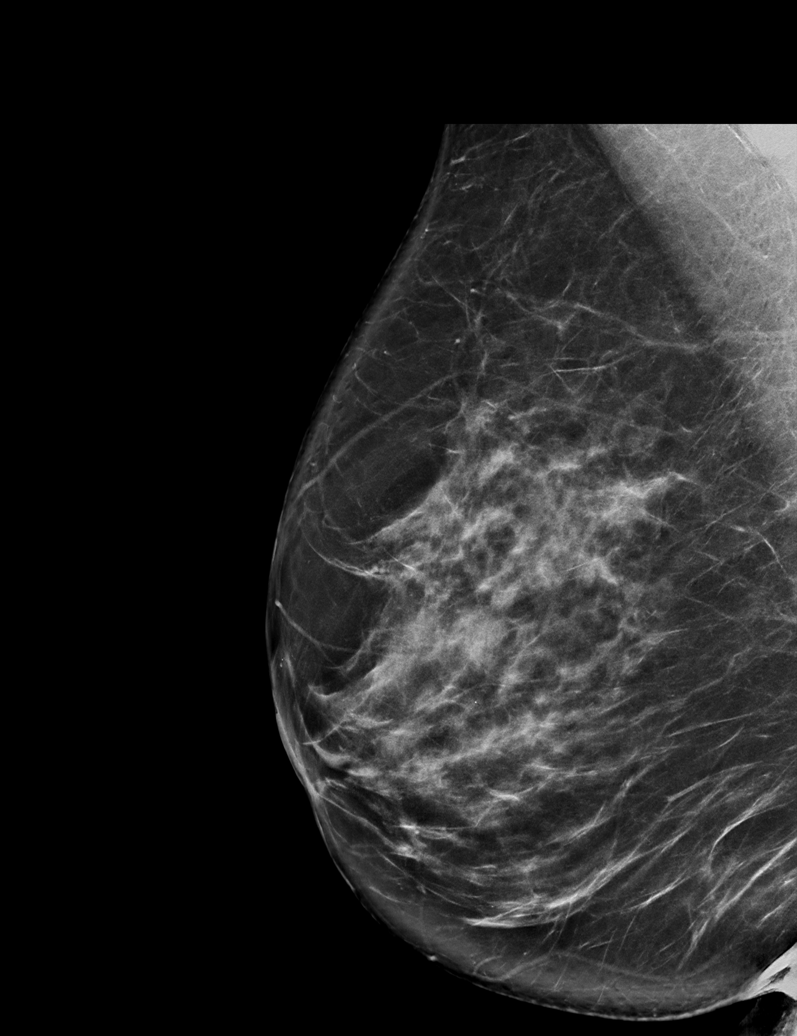

[L CC synth-2D]
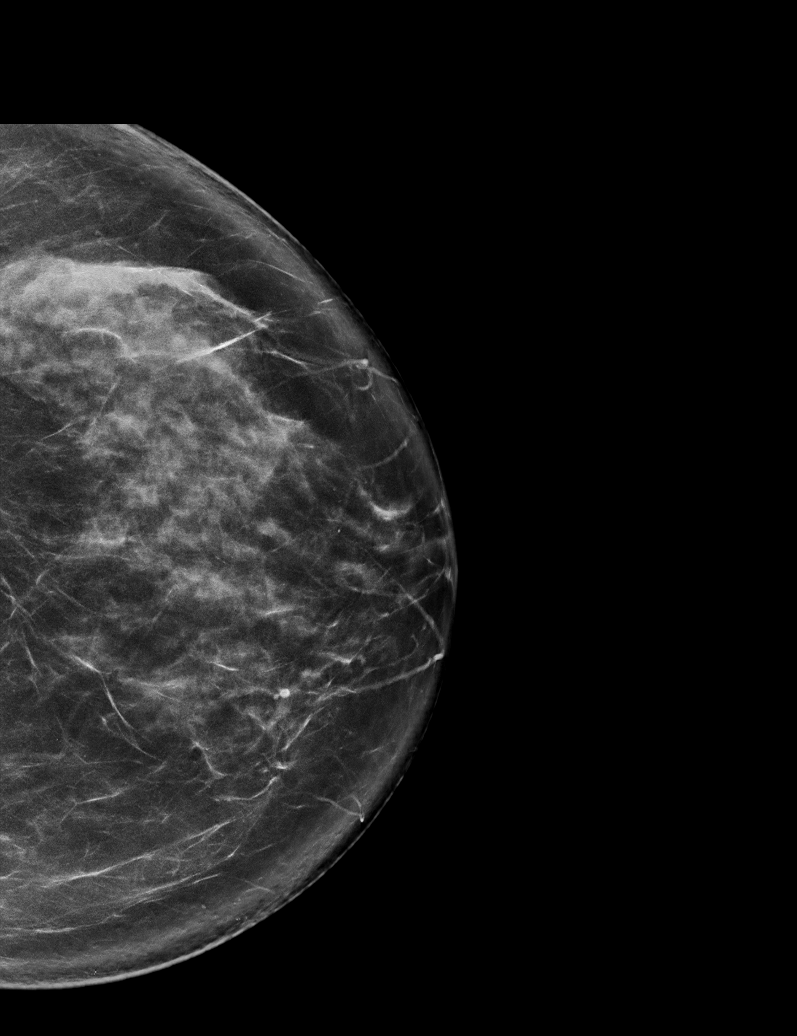

[R CC synth-2D]
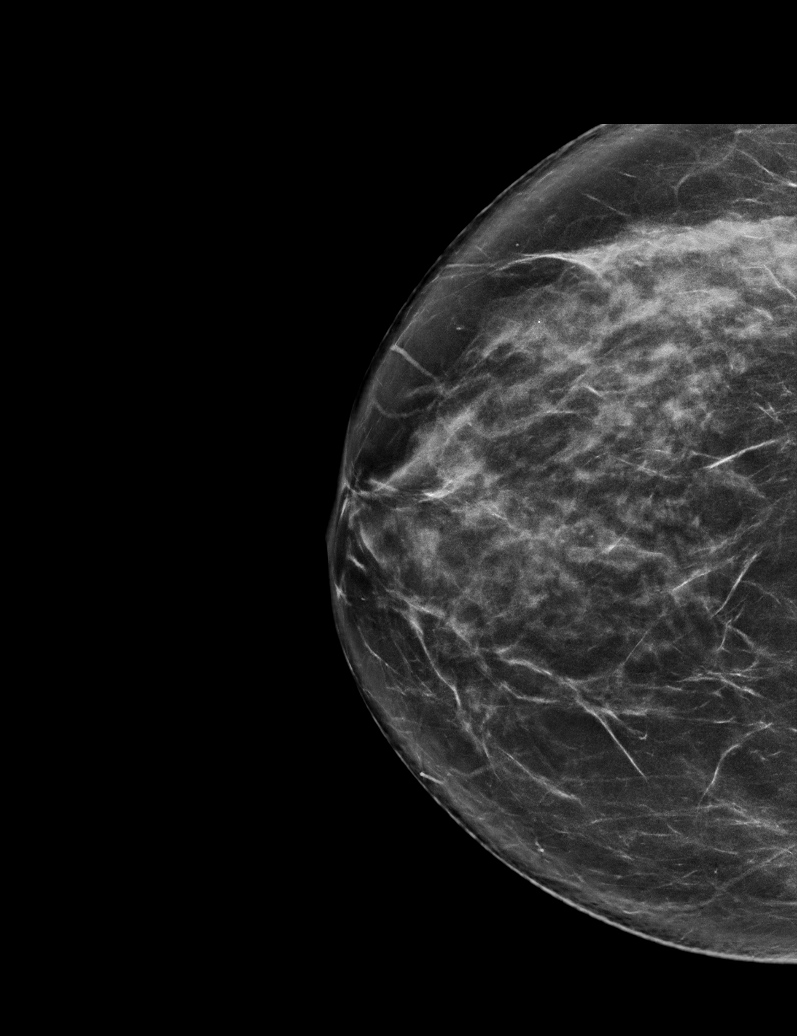

[L MLO synth-2D (2 of 2)]
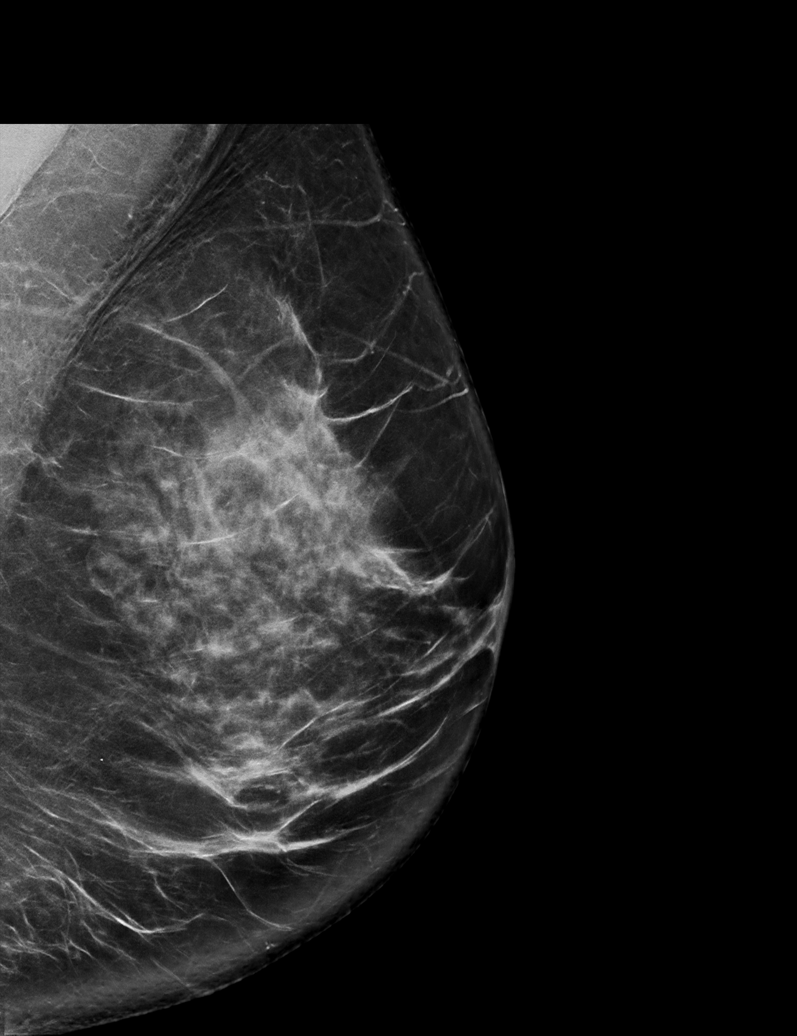

[L MLO tomo · tomo slice 43/85.0]
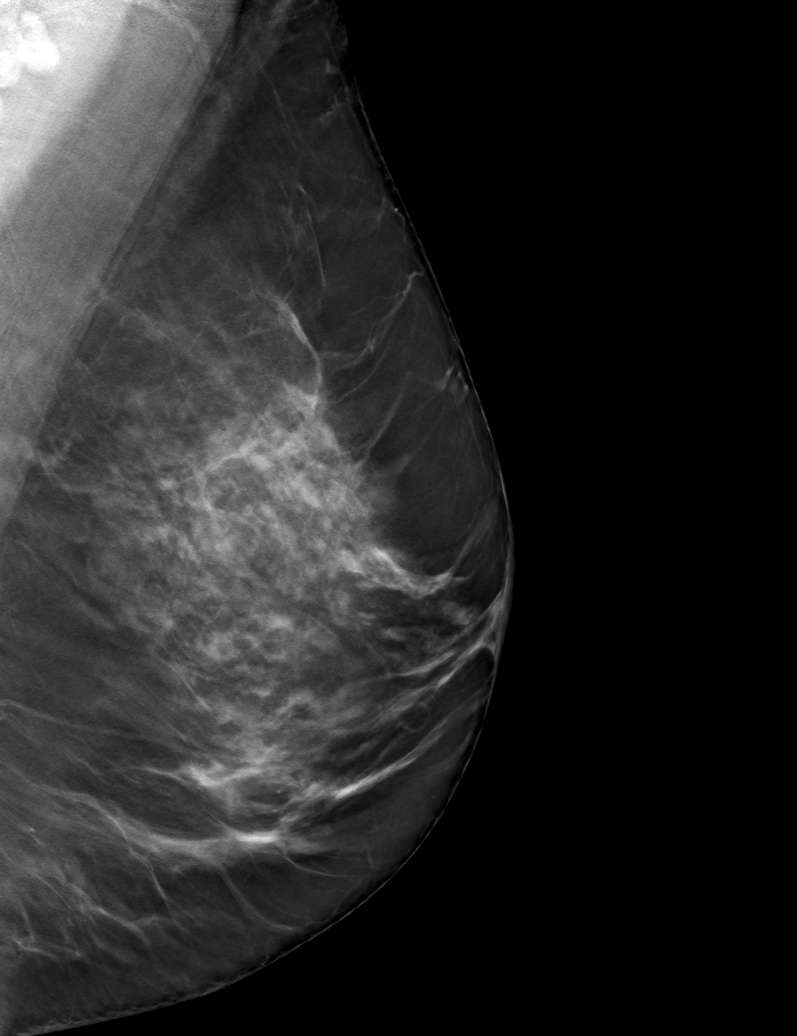

[6 of 30 positions shown; findings below may reference images not displayed]

ACR Breast Density Category c: The breast tissue is heterogeneously
dense, which may obscure small masses.
FINDINGS: There are no findings suspicious for malignancy. Images were
processed with CAD.
IMPRESSION: No mammographic evidence of malignancy. A result letter of this
screening mammogram will be mailed directly to the patient.

RECOMMENDATION:
Screening mammogram in one year. (Code:[id number])

BI-RADS CATEGORY  1: Negative.

## 2021-06-01 DIAGNOSIS — Z91018 Allergy to other foods: Secondary | ICD-10-CM | POA: Diagnosis not present

## 2021-06-01 DIAGNOSIS — D649 Anemia, unspecified: Secondary | ICD-10-CM | POA: Diagnosis not present

## 2021-06-01 DIAGNOSIS — E7849 Other hyperlipidemia: Secondary | ICD-10-CM | POA: Diagnosis not present

## 2021-06-01 DIAGNOSIS — E559 Vitamin D deficiency, unspecified: Secondary | ICD-10-CM | POA: Diagnosis not present

## 2021-06-01 DIAGNOSIS — L821 Other seborrheic keratosis: Secondary | ICD-10-CM | POA: Diagnosis not present

## 2021-06-01 DIAGNOSIS — Z6826 Body mass index (BMI) 26.0-26.9, adult: Secondary | ICD-10-CM | POA: Diagnosis not present

## 2021-06-01 DIAGNOSIS — E663 Overweight: Secondary | ICD-10-CM | POA: Diagnosis not present

## 2021-06-01 DIAGNOSIS — E782 Mixed hyperlipidemia: Secondary | ICD-10-CM | POA: Diagnosis not present

## 2021-06-01 DIAGNOSIS — Z0001 Encounter for general adult medical examination with abnormal findings: Secondary | ICD-10-CM | POA: Diagnosis not present

## 2021-07-12 ENCOUNTER — Other Ambulatory Visit: Payer: Self-pay | Admitting: General Surgery

## 2021-07-12 DIAGNOSIS — Z1231 Encounter for screening mammogram for malignant neoplasm of breast: Secondary | ICD-10-CM

## 2021-07-24 DIAGNOSIS — M542 Cervicalgia: Secondary | ICD-10-CM | POA: Diagnosis not present

## 2021-07-24 DIAGNOSIS — M549 Dorsalgia, unspecified: Secondary | ICD-10-CM | POA: Diagnosis not present

## 2021-08-10 ENCOUNTER — Ambulatory Visit: Payer: BC Managed Care – PPO

## 2021-08-12 ENCOUNTER — Ambulatory Visit: Payer: BC Managed Care – PPO

## 2021-08-12 ENCOUNTER — Ambulatory Visit
Admission: RE | Admit: 2021-08-12 | Discharge: 2021-08-12 | Disposition: A | Discharge status: Home / Self Care | Payer: BC Managed Care – PPO | Source: Ambulatory Visit | Attending: General Surgery | Admitting: General Surgery

## 2021-08-12 DIAGNOSIS — Z1231 Encounter for screening mammogram for malignant neoplasm of breast: Secondary | ICD-10-CM

## 2021-10-05 DIAGNOSIS — F411 Generalized anxiety disorder: Secondary | ICD-10-CM | POA: Diagnosis not present

## 2021-11-07 DIAGNOSIS — F411 Generalized anxiety disorder: Secondary | ICD-10-CM | POA: Diagnosis not present

## 2022-01-16 DIAGNOSIS — H1132 Conjunctival hemorrhage, left eye: Secondary | ICD-10-CM | POA: Diagnosis not present

## 2022-01-16 DIAGNOSIS — Z6825 Body mass index (BMI) 25.0-25.9, adult: Secondary | ICD-10-CM | POA: Diagnosis not present

## 2022-01-16 DIAGNOSIS — E538 Deficiency of other specified B group vitamins: Secondary | ICD-10-CM | POA: Diagnosis not present

## 2022-05-04 ENCOUNTER — Encounter: Payer: Self-pay | Admitting: Radiology

## 2022-06-24 DIAGNOSIS — R053 Chronic cough: Secondary | ICD-10-CM | POA: Diagnosis not present

## 2022-06-24 DIAGNOSIS — J069 Acute upper respiratory infection, unspecified: Secondary | ICD-10-CM | POA: Diagnosis not present

## 2022-06-24 DIAGNOSIS — R0982 Postnasal drip: Secondary | ICD-10-CM | POA: Diagnosis not present

## 2022-07-13 ENCOUNTER — Telehealth: Payer: Self-pay

## 2022-07-13 NOTE — Telephone Encounter (Signed)
Patient left message wanting to schedule an appointment with Dr.Harrison for her L Knee. I returned her call and had to leave a message for her to call the office.

## 2022-07-17 ENCOUNTER — Other Ambulatory Visit (INDEPENDENT_AMBULATORY_CARE_PROVIDER_SITE_OTHER): Payer: BC Managed Care – PPO

## 2022-07-17 ENCOUNTER — Encounter: Payer: Self-pay | Admitting: Orthopedic Surgery

## 2022-07-17 ENCOUNTER — Ambulatory Visit: Payer: BC Managed Care – PPO | Admitting: Orthopedic Surgery

## 2022-07-17 VITALS — BP 123/90 | HR 98 | Ht 67.0 in | Wt 175.0 lb

## 2022-07-17 DIAGNOSIS — M25562 Pain in left knee: Secondary | ICD-10-CM

## 2022-07-17 DIAGNOSIS — R051 Acute cough: Secondary | ICD-10-CM | POA: Diagnosis not present

## 2022-07-17 DIAGNOSIS — M25462 Effusion, left knee: Secondary | ICD-10-CM

## 2022-07-17 MED ORDER — MELOXICAM 7.5 MG PO TABS: 7.5000 mg | ORAL_TABLET | Freq: Every day | ORAL | 5 refills | Status: AC

## 2022-07-17 MED ORDER — METHYLPREDNISOLONE ACETATE 40 MG/ML IJ SUSP
40.0000 mg | Freq: Once | INTRAMUSCULAR | Status: AC
  Administered 2022-07-17: 40 mg via INTRA_ARTICULAR

## 2022-07-17 MED ORDER — DM-GUAIFENESIN ER 30-600 MG PO TB12: 1.0000 | ORAL_TABLET | Freq: Two times a day (BID) | ORAL | 5 refills | Status: AC

## 2022-07-17 NOTE — Addendum Note (Signed)
Addended byCaffie Damme on: 07/17/2022 04:07 PM   Modules accepted: Orders

## 2022-07-17 NOTE — Progress Notes (Signed)
Chief Complaint  Patient presents with   Knee Pain    Left has been painful and swelling for a while for a couple months    BP (!) 123/90   Pulse 98   Ht 5\' 7"  (1.702 m)   Wt 175 lb (79.4 kg)   LMP 09/07/2016 (Approximate)   BMI 27.41 kg/m    Last seen for this problem August 2022  She had an aspiration injection for effusion thought to be brought on by over exertion at an exercise class  Comes in today with pain and swelling of the left knee for 2 months  No injury   ROS cough with chest congestion took amoxicillin clavulanic acid for 2 weeks now out of the medicine still coughing would like something for the no fever does not feel ill  Initially coughed up some green phlegm now it is clear  Initially had a lot of rattling in the chest  Right Knee Exam   Tenderness  The patient is experiencing tenderness in the patella.  Range of Motion  Extension:  normal  Flexion:  normal   Tests  McMurray:  Medial - negative Lateral - negative Varus: negative Valgus: negative Lachman:  Anterior - negative    Posterior - negative Drawer:  Anterior - negative    Posterior - negative Patellar apprehension: negative  Other  Erythema: absent Scars: absent Sensation: normal Pulse: present Swelling: mild Effusion: effusion present    X-ray shows on mild OA and what appears to be a bone spur on the lateral patellofemoral region or calcification of the ligament  Attempted aspiration unsuccessful injected through a superolateral approach  Procedure note left knee injection   verbal consent was obtained to inject left knee joint  Timeout was completed to confirm the site of injection  The medications used were depomedrol 40 mg and 1% lidocaine 3 cc Anesthesia was provided by ethyl chloride and the skin was prepped with alcohol.  After cleaning the skin with alcohol a 20-gauge needle was used to inject the left knee joint. There were no complications. A sterile bandage was  applied.   I gave her some medication for cough suppression and expectoration  Also medication for osteoarthritis of the knee Meds ordered this encounter  Medications   dextromethorphan-guaiFENesin (MUCINEX DM) 30-600 MG 12hr tablet    Sig: Take 1 tablet by mouth 2 (two) times daily.    Dispense:  30 tablet    Refill:  5   meloxicam (MOBIC) 7.5 MG tablet    Sig: Take 1 tablet (7.5 mg total) by mouth daily.    Dispense:  30 tablet    Refill:  5

## 2022-08-07 ENCOUNTER — Other Ambulatory Visit: Payer: Self-pay | Admitting: General Surgery

## 2022-08-07 DIAGNOSIS — Z1231 Encounter for screening mammogram for malignant neoplasm of breast: Secondary | ICD-10-CM

## 2022-08-25 ENCOUNTER — Ambulatory Visit
Admission: RE | Admit: 2022-08-25 | Discharge: 2022-08-25 | Disposition: A | Discharge status: Home / Self Care | Payer: BC Managed Care – PPO | Source: Ambulatory Visit | Attending: General Surgery | Admitting: General Surgery

## 2022-08-25 DIAGNOSIS — Z1231 Encounter for screening mammogram for malignant neoplasm of breast: Secondary | ICD-10-CM | POA: Diagnosis not present

## 2022-08-30 ENCOUNTER — Other Ambulatory Visit: Payer: Self-pay | Admitting: General Surgery

## 2022-08-30 DIAGNOSIS — R928 Other abnormal and inconclusive findings on diagnostic imaging of breast: Secondary | ICD-10-CM

## 2022-09-05 ENCOUNTER — Ambulatory Visit
Admission: RE | Admit: 2022-09-05 | Discharge: 2022-09-05 | Disposition: A | Discharge status: Home / Self Care | Payer: BC Managed Care – PPO | Source: Ambulatory Visit | Attending: General Surgery | Admitting: General Surgery

## 2022-09-05 DIAGNOSIS — R928 Other abnormal and inconclusive findings on diagnostic imaging of breast: Secondary | ICD-10-CM

## 2022-11-03 DIAGNOSIS — E782 Mixed hyperlipidemia: Secondary | ICD-10-CM | POA: Diagnosis not present

## 2022-11-03 DIAGNOSIS — E663 Overweight: Secondary | ICD-10-CM | POA: Diagnosis not present

## 2022-11-03 DIAGNOSIS — E559 Vitamin D deficiency, unspecified: Secondary | ICD-10-CM | POA: Diagnosis not present

## 2022-11-03 DIAGNOSIS — Z20828 Contact with and (suspected) exposure to other viral communicable diseases: Secondary | ICD-10-CM | POA: Diagnosis not present

## 2022-11-03 DIAGNOSIS — J029 Acute pharyngitis, unspecified: Secondary | ICD-10-CM | POA: Diagnosis not present

## 2022-11-03 DIAGNOSIS — E7849 Other hyperlipidemia: Secondary | ICD-10-CM | POA: Diagnosis not present

## 2022-11-03 DIAGNOSIS — Z6826 Body mass index (BMI) 26.0-26.9, adult: Secondary | ICD-10-CM | POA: Diagnosis not present

## 2022-11-03 DIAGNOSIS — E538 Deficiency of other specified B group vitamins: Secondary | ICD-10-CM | POA: Diagnosis not present

## 2022-11-03 DIAGNOSIS — D649 Anemia, unspecified: Secondary | ICD-10-CM | POA: Diagnosis not present

## 2023-03-03 DIAGNOSIS — Z961 Presence of intraocular lens: Secondary | ICD-10-CM | POA: Diagnosis not present

## 2023-03-03 DIAGNOSIS — H26493 Other secondary cataract, bilateral: Secondary | ICD-10-CM | POA: Diagnosis not present

## 2023-04-03 DIAGNOSIS — J069 Acute upper respiratory infection, unspecified: Secondary | ICD-10-CM | POA: Diagnosis not present

## 2023-04-03 DIAGNOSIS — R051 Acute cough: Secondary | ICD-10-CM | POA: Diagnosis not present

## 2023-04-03 DIAGNOSIS — Z20822 Contact with and (suspected) exposure to covid-19: Secondary | ICD-10-CM | POA: Diagnosis not present

## 2023-04-03 DIAGNOSIS — R509 Fever, unspecified: Secondary | ICD-10-CM | POA: Diagnosis not present

## 2023-04-07 DIAGNOSIS — J209 Acute bronchitis, unspecified: Secondary | ICD-10-CM | POA: Diagnosis not present

## 2023-04-07 DIAGNOSIS — R062 Wheezing: Secondary | ICD-10-CM | POA: Diagnosis not present

## 2023-08-02 ENCOUNTER — Other Ambulatory Visit: Payer: Self-pay | Admitting: General Surgery

## 2023-08-02 DIAGNOSIS — Z1231 Encounter for screening mammogram for malignant neoplasm of breast: Secondary | ICD-10-CM

## 2023-08-03 DIAGNOSIS — F411 Generalized anxiety disorder: Secondary | ICD-10-CM | POA: Diagnosis not present

## 2023-08-28 ENCOUNTER — Ambulatory Visit

## 2023-08-30 ENCOUNTER — Ambulatory Visit
Admission: RE | Admit: 2023-08-30 | Discharge: 2023-08-30 | Disposition: A | Discharge status: Home / Self Care | Source: Ambulatory Visit | Attending: General Surgery | Admitting: General Surgery

## 2023-08-30 DIAGNOSIS — Z1231 Encounter for screening mammogram for malignant neoplasm of breast: Secondary | ICD-10-CM
